# Patient Record
Sex: Male | Born: 1985 | Hispanic: No | Marital: Single | Smoking: Former smoker
Health system: Southern US, Community
[De-identification: ages and names within clinical notes are randomized; demographics above are authoritative.]

---

## 2019-03-19 ENCOUNTER — Other Ambulatory Visit: Payer: Self-pay | Admitting: Family Medicine

## 2019-03-19 DIAGNOSIS — R1011 Right upper quadrant pain: Secondary | ICD-10-CM

## 2019-03-20 ENCOUNTER — Other Ambulatory Visit: Payer: Self-pay | Admitting: Family Medicine

## 2019-03-20 DIAGNOSIS — R1011 Right upper quadrant pain: Secondary | ICD-10-CM

## 2019-03-21 ENCOUNTER — Ambulatory Visit
Admission: RE | Admit: 2019-03-21 | Discharge: 2019-03-21 | Disposition: A | Discharge status: Home / Self Care | Payer: BLUE CROSS/BLUE SHIELD | Source: Ambulatory Visit | Attending: Family Medicine | Admitting: Family Medicine

## 2019-03-21 DIAGNOSIS — R1011 Right upper quadrant pain: Secondary | ICD-10-CM

## 2020-09-12 IMAGING — US ULTRASOUND ABDOMEN LIMITED
1 series · 14 of 25 positions shown · non-contrast
Comparison: None.

CLINICAL DATA: Nausea and vomiting

EXAM:
ULTRASOUND ABDOMEN LIMITED RIGHT UPPER QUADRANT

[Series 1: ultrasound abdomen limited · 0.19mm/px · 14 of 46 slices shown]
[im 1/46]
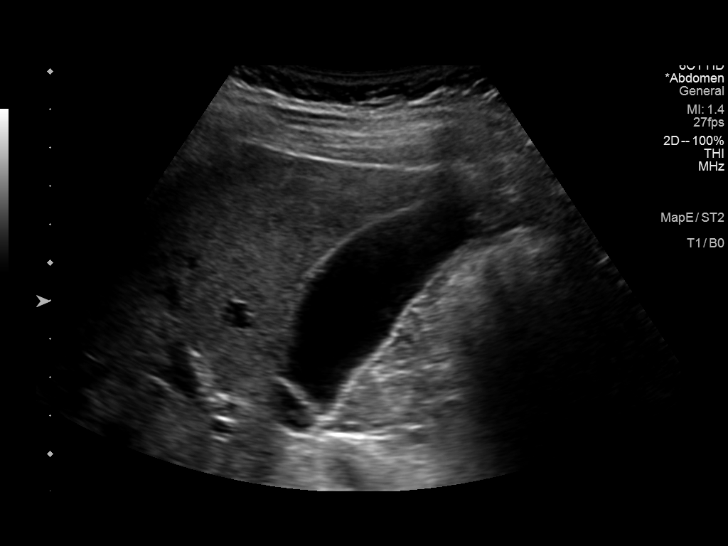
[im 4/46]
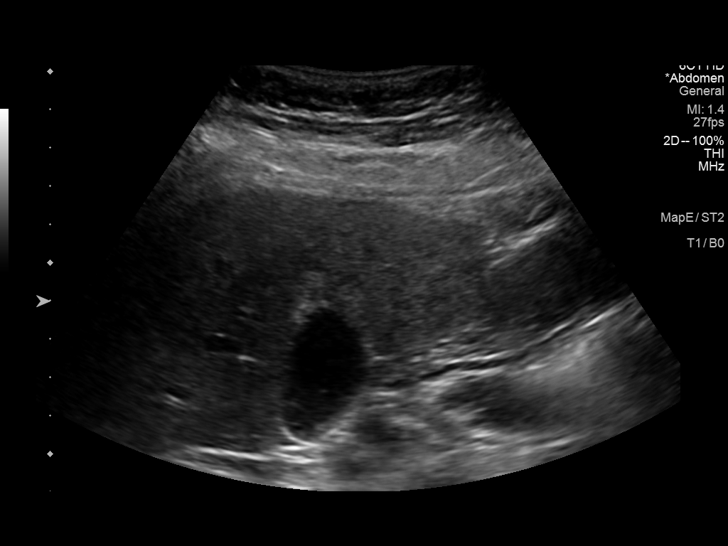
[im 8/46]
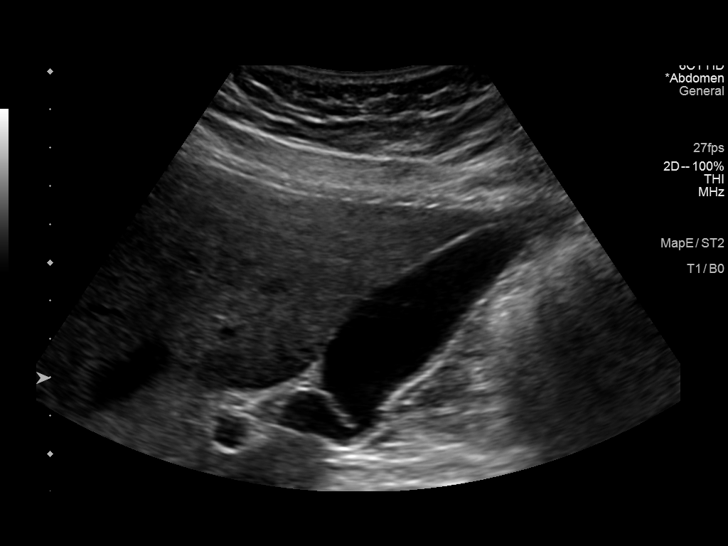
[im 12/46]
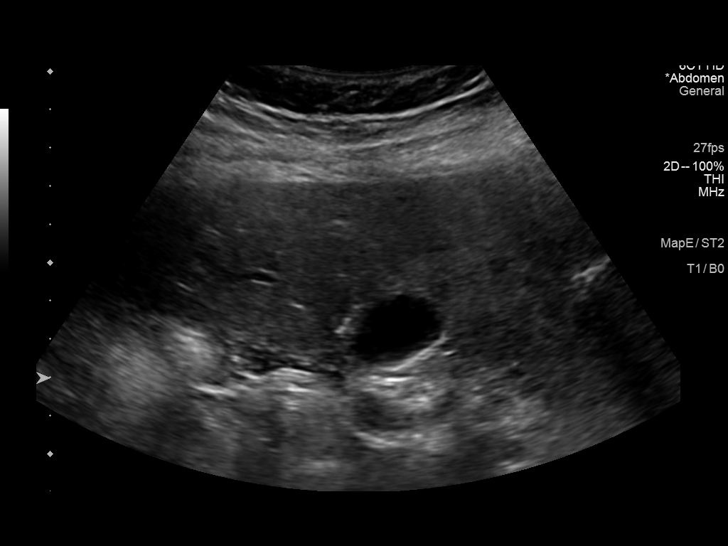
[im 16/46]
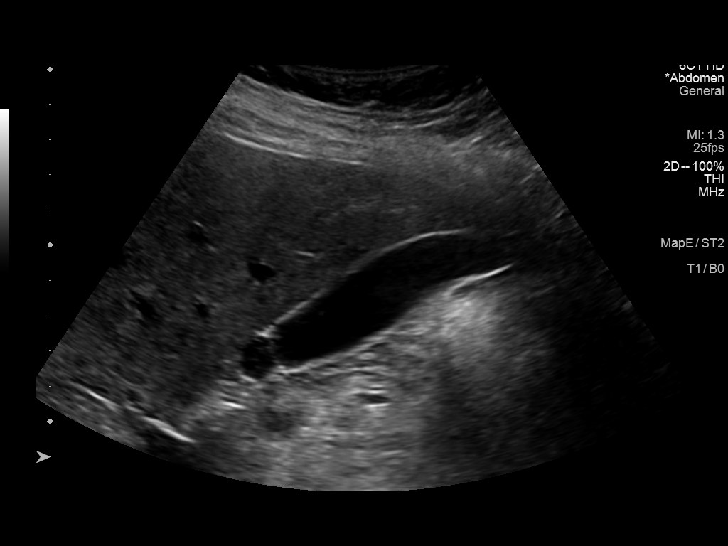
[im 17/46]
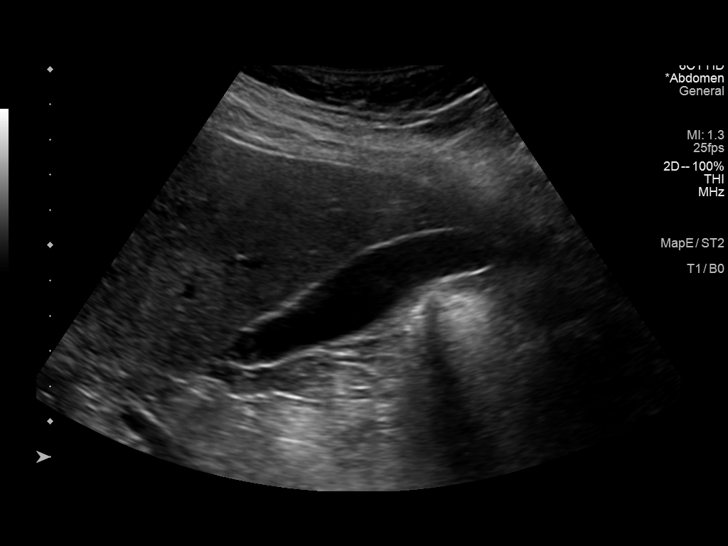
[im 21/46]
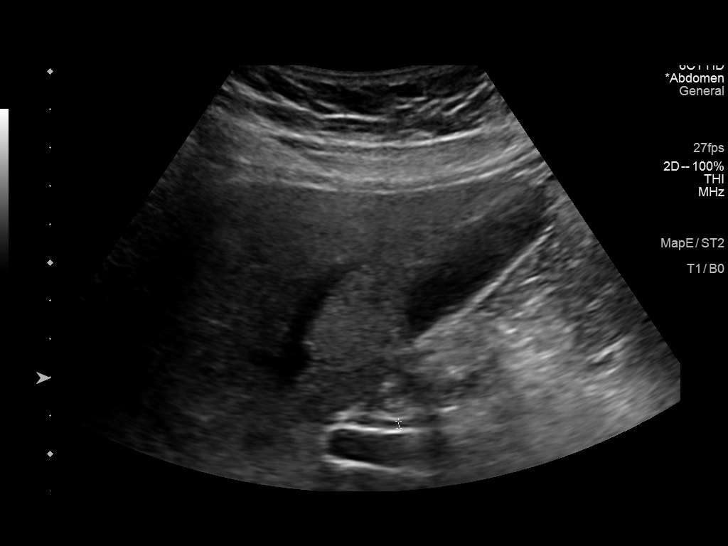
[im 25/46]
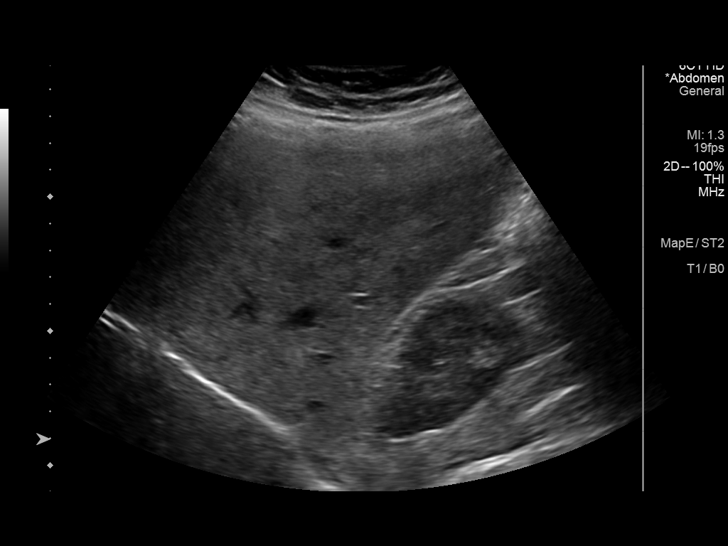
[im 29/46]
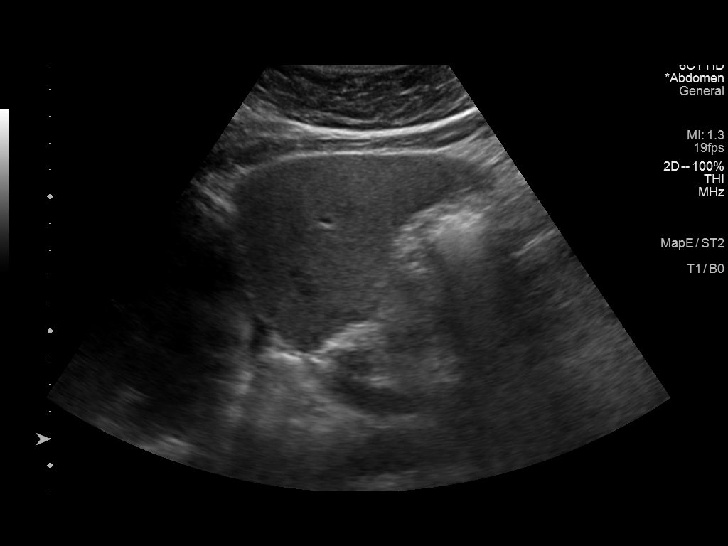
[im 31/46]
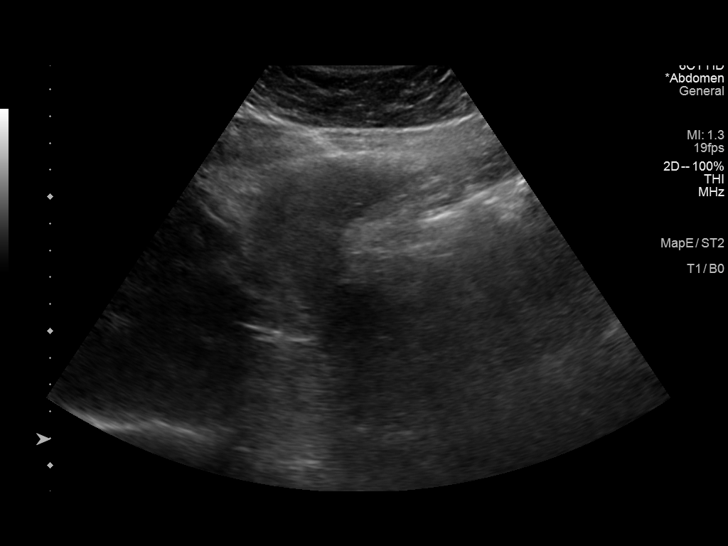
[im 34/46]
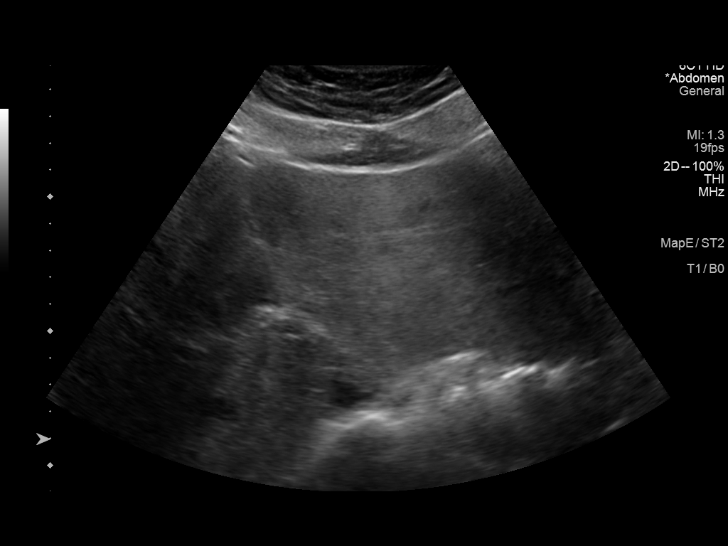
[im 38/46]
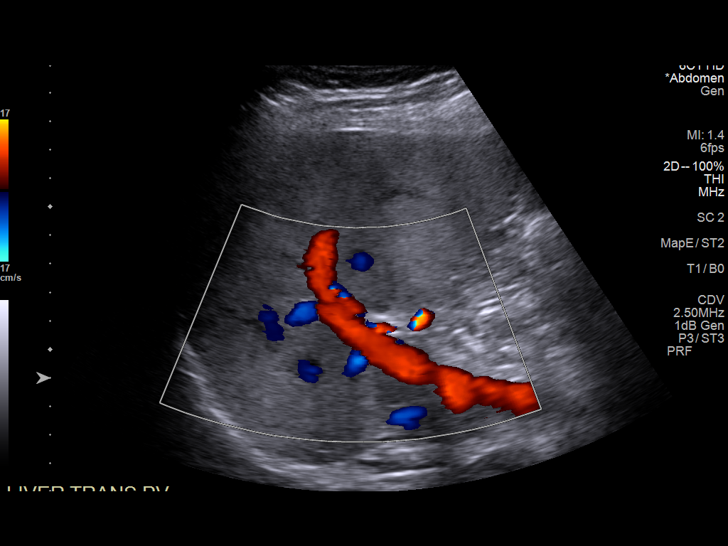
[im 42/46]
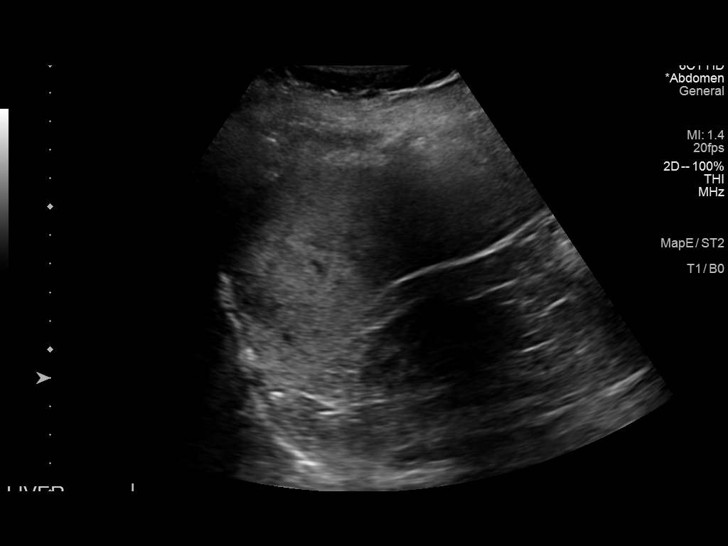
[im 46/46]
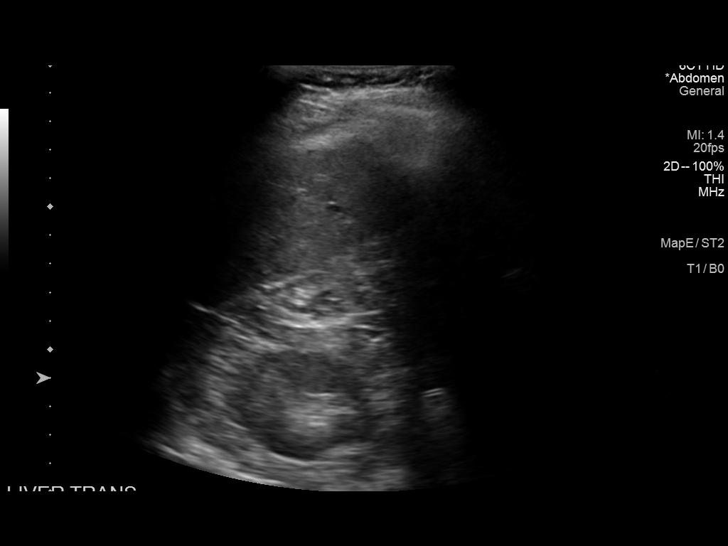

[14 of 25 positions shown; findings below may reference images not displayed]

FINDINGS: Gallbladder:

No gallstones or wall thickening visualized. No sonographic Murphy
sign noted by sonographer.

Common bile duct:

Diameter: Normal at 2 mm

Liver:

In the subcapsular RIGHT hepatic lobe hypoechoic lesion measures 13
mm x 13 mm.

Liver normal limits in parenchymal echogenicity. Portal vein is
patent on color Doppler imaging with normal direction of blood flow
towards the liver.
IMPRESSION: 1. Normal gallbladder.  No biliary duct dilatation.
2. Indeterminate lesion in the subcapsular RIGHT hepatic lobe.
Recommend contrast MRI for further evaluation.

## 2020-10-16 ENCOUNTER — Encounter (HOSPITAL_COMMUNITY)
Admission: EM | Disposition: A | Discharge status: Home / Self Care | Payer: Self-pay | Source: Home / Self Care | Attending: Emergency Medicine

## 2020-10-16 ENCOUNTER — Encounter (HOSPITAL_COMMUNITY): Payer: Self-pay | Admitting: *Deleted

## 2020-10-16 ENCOUNTER — Other Ambulatory Visit: Payer: Self-pay

## 2020-10-16 ENCOUNTER — Ambulatory Visit (HOSPITAL_COMMUNITY)
Admission: EM | Admit: 2020-10-16 | Discharge: 2020-10-16 | Disposition: A | Discharge status: Home / Self Care | Payer: Self-pay | Attending: Emergency Medicine | Admitting: Emergency Medicine

## 2020-10-16 ENCOUNTER — Emergency Department (HOSPITAL_COMMUNITY): Payer: Self-pay | Admitting: Registered Nurse

## 2020-10-16 ENCOUNTER — Emergency Department (HOSPITAL_COMMUNITY): Payer: Self-pay

## 2020-10-16 DIAGNOSIS — Z881 Allergy status to other antibiotic agents status: Secondary | ICD-10-CM | POA: Insufficient documentation

## 2020-10-16 DIAGNOSIS — Z20822 Contact with and (suspected) exposure to covid-19: Secondary | ICD-10-CM | POA: Insufficient documentation

## 2020-10-16 DIAGNOSIS — Z87891 Personal history of nicotine dependence: Secondary | ICD-10-CM | POA: Insufficient documentation

## 2020-10-16 DIAGNOSIS — N4400 Torsion of testis, unspecified: Secondary | ICD-10-CM | POA: Insufficient documentation

## 2020-10-16 HISTORY — PX: ORCHIECTOMY: SHX2116

## 2020-10-16 LAB — CBC
HCT: 43.5 % (ref 39.0–52.0)
Hemoglobin: 14 g/dL (ref 13.0–17.0)
MCH: 27.4 pg (ref 26.0–34.0)
MCHC: 32.2 g/dL (ref 30.0–36.0)
MCV: 85.1 fL (ref 80.0–100.0)
Platelets: 321 10*3/uL (ref 150–400)
RBC: 5.11 MIL/uL (ref 4.22–5.81)
RDW: 15.4 % (ref 11.5–15.5)
WBC: 15.4 10*3/uL — ABNORMAL HIGH (ref 4.0–10.5)
nRBC: 0 % (ref 0.0–0.2)

## 2020-10-16 LAB — COMPREHENSIVE METABOLIC PANEL
ALT: 22 U/L (ref 0–44)
AST: 28 U/L (ref 15–41)
Albumin: 4.8 g/dL (ref 3.5–5.0)
Alkaline Phosphatase: 64 U/L (ref 38–126)
Anion gap: 14 (ref 5–15)
BUN: 11 mg/dL (ref 6–20)
CO2: 23 mmol/L (ref 22–32)
Calcium: 9.3 mg/dL (ref 8.9–10.3)
Chloride: 104 mmol/L (ref 98–111)
Creatinine, Ser: 1.18 mg/dL (ref 0.61–1.24)
GFR, Estimated: >60 mL/min (ref >60)
Glucose, Bld: 119 mg/dL — ABNORMAL HIGH (ref 70–99)
Potassium: 3.4 mmol/L — ABNORMAL LOW (ref 3.5–5.1)
Sodium: 141 mmol/L (ref 135–145)
Total Bilirubin: 0.6 mg/dL (ref 0.3–1.2)
Total Protein: 7.8 g/dL (ref 6.5–8.1)

## 2020-10-16 LAB — RESP PANEL BY RT-PCR (FLU A&B, COVID) ARPGX2
Influenza A by PCR: NEGATIVE
Influenza B by PCR: NEGATIVE
SARS Coronavirus 2 by RT PCR: NEGATIVE

## 2020-10-16 LAB — LIPASE, BLOOD: Lipase: 47 U/L (ref 11–51)

## 2020-10-16 SURGERY — ORCHIECTOMY
Anesthesia: General | Site: Scrotum | Laterality: Bilateral

## 2020-10-16 MED ORDER — HYDROMORPHONE HCL 1 MG/ML IJ SOLN
1.0000 mg | Freq: Once | INTRAMUSCULAR | Status: AC
  Administered 2020-10-16: 09:00:00 1 mg via INTRAVENOUS
  Filled 2020-10-16: qty 1

## 2020-10-16 MED ORDER — BUPIVACAINE-EPINEPHRINE 0.5% -1:200000 IJ SOLN
INTRAMUSCULAR | Status: DC | PRN
  Administered 2020-10-16: 20 mL

## 2020-10-16 MED ORDER — CEFAZOLIN SODIUM-DEXTROSE 2-4 GM/100ML-% IV SOLN
2.0000 g | Freq: Once | INTRAVENOUS | Status: AC
  Administered 2020-10-16: 12:00:00 2 g via INTRAVENOUS

## 2020-10-16 MED ORDER — FENTANYL CITRATE (PF) 250 MCG/5ML IJ SOLN
INTRAMUSCULAR | Status: AC
  Filled 2020-10-16: qty 5

## 2020-10-16 MED ORDER — 0.9 % SODIUM CHLORIDE (POUR BTL) OPTIME
TOPICAL | Status: DC | PRN
Start: 2020-10-16 — End: 2020-10-16
  Administered 2020-10-16: 12:00:00 1000 mL

## 2020-10-16 MED ORDER — ONDANSETRON HCL 4 MG/2ML IJ SOLN
INTRAMUSCULAR | Status: AC
  Filled 2020-10-16: qty 2

## 2020-10-16 MED ORDER — GLYCOPYRROLATE PF 0.2 MG/ML IJ SOSY
PREFILLED_SYRINGE | INTRAMUSCULAR | Status: DC | PRN
  Administered 2020-10-16: .1 mg via INTRAVENOUS

## 2020-10-16 MED ORDER — PROPOFOL 10 MG/ML IV BOLUS
INTRAVENOUS | Status: DC | PRN
  Administered 2020-10-16: 200 mg via INTRAVENOUS

## 2020-10-16 MED ORDER — LACTATED RINGERS IV SOLN: INTRAVENOUS | Status: DC | PRN

## 2020-10-16 MED ORDER — HYDROCODONE-ACETAMINOPHEN 5-325 MG PO TABS: 1.0000 | ORAL_TABLET | ORAL | 0 refills | Status: DC | PRN

## 2020-10-16 MED ORDER — HYDROMORPHONE HCL 1 MG/ML IJ SOLN
1.0000 mg | Freq: Once | INTRAMUSCULAR | Status: AC
  Administered 2020-10-16: 08:00:00 1 mg via INTRAVENOUS
  Filled 2020-10-16: qty 1

## 2020-10-16 MED ORDER — DEXAMETHASONE SODIUM PHOSPHATE 10 MG/ML IJ SOLN
INTRAMUSCULAR | Status: DC | PRN
  Administered 2020-10-16: 10 mg via INTRAVENOUS

## 2020-10-16 MED ORDER — SUCCINYLCHOLINE CHLORIDE 200 MG/10ML IV SOSY
PREFILLED_SYRINGE | INTRAVENOUS | Status: DC | PRN
  Administered 2020-10-16: 20 mg via INTRAVENOUS

## 2020-10-16 MED ORDER — ONDANSETRON HCL 4 MG/2ML IJ SOLN
INTRAMUSCULAR | Status: DC | PRN
  Administered 2020-10-16: 4 mg via INTRAVENOUS

## 2020-10-16 MED ORDER — ONDANSETRON HCL 4 MG/2ML IJ SOLN
4.0000 mg | Freq: Once | INTRAMUSCULAR | Status: AC
  Administered 2020-10-16: 08:00:00 4 mg via INTRAVENOUS
  Filled 2020-10-16: qty 2

## 2020-10-16 MED ORDER — PROPOFOL 10 MG/ML IV BOLUS
INTRAVENOUS | Status: AC
  Filled 2020-10-16: qty 40

## 2020-10-16 MED ORDER — FENTANYL CITRATE (PF) 100 MCG/2ML IJ SOLN: 25.0000 ug | INTRAMUSCULAR | Status: DC | PRN

## 2020-10-16 MED ORDER — OXYCODONE HCL 5 MG/5ML PO SOLN: 5.0000 mg | Freq: Once | ORAL | Status: DC | PRN

## 2020-10-16 MED ORDER — ONDANSETRON HCL 4 MG/2ML IJ SOLN: 4.0000 mg | Freq: Four times a day (QID) | INTRAMUSCULAR | Status: DC | PRN

## 2020-10-16 MED ORDER — OXYCODONE HCL 5 MG PO TABS
5.0000 mg | ORAL_TABLET | Freq: Once | ORAL | Status: DC | PRN
Start: 2020-10-16 — End: 2020-10-16

## 2020-10-16 MED ORDER — CEFAZOLIN SODIUM-DEXTROSE 2-4 GM/100ML-% IV SOLN
INTRAVENOUS | Status: AC
  Filled 2020-10-16: qty 100

## 2020-10-16 MED ORDER — MIDAZOLAM HCL 2 MG/2ML IJ SOLN
INTRAMUSCULAR | Status: AC
  Filled 2020-10-16: qty 2

## 2020-10-16 MED ORDER — MIDAZOLAM HCL 5 MG/5ML IJ SOLN
INTRAMUSCULAR | Status: DC | PRN
  Administered 2020-10-16 (×2): 2 mg via INTRAVENOUS

## 2020-10-16 MED ORDER — FENTANYL CITRATE (PF) 100 MCG/2ML IJ SOLN
INTRAMUSCULAR | Status: DC | PRN
  Administered 2020-10-16: 100 ug via INTRAVENOUS
  Administered 2020-10-16 (×2): 50 ug via INTRAVENOUS

## 2020-10-16 MED ORDER — LIDOCAINE 2% (20 MG/ML) 5 ML SYRINGE: INTRAMUSCULAR | Status: DC | PRN

## 2020-10-16 MED ORDER — BUPIVACAINE HCL (PF) 0.5 % IJ SOLN
INTRAMUSCULAR | Status: AC
  Filled 2020-10-16: qty 30

## 2020-10-16 MED ORDER — LIDOCAINE 2% (20 MG/ML) 5 ML SYRINGE
INTRAMUSCULAR | Status: DC | PRN
  Administered 2020-10-16: 50 mg via INTRAVENOUS

## 2020-10-16 MED ORDER — DEXAMETHASONE SODIUM PHOSPHATE 10 MG/ML IJ SOLN
INTRAMUSCULAR | Status: AC
  Filled 2020-10-16: qty 1

## 2020-10-16 MED ORDER — FENTANYL CITRATE (PF) 100 MCG/2ML IJ SOLN
INTRAMUSCULAR | Status: AC
  Filled 2020-10-16: qty 2

## 2020-10-16 SURGICAL SUPPLY — 28 items
BENZOIN TINCTURE PRP APPL 2/3 (GAUZE/BANDAGES/DRESSINGS) ×3 IMPLANT
BNDG GAUZE ELAST 4 BULKY (GAUZE/BANDAGES/DRESSINGS) ×3 IMPLANT
COVER WAND RF STERILE (DRAPES) IMPLANT
DECANTER SPIKE VIAL GLASS SM (MISCELLANEOUS) ×3 IMPLANT
DERMABOND ADVANCED (GAUZE/BANDAGES/DRESSINGS) ×2
DERMABOND ADVANCED .7 DNX12 (GAUZE/BANDAGES/DRESSINGS) ×1 IMPLANT
DRAIN PENROSE 0.25X18 (DRAIN) IMPLANT
DRAIN PENROSE 0.5X18 (DRAIN) IMPLANT
DRAPE LAPAROTOMY T 98X78 PEDS (DRAPES) ×3 IMPLANT
ELECT REM PT RETURN 15FT ADLT (MISCELLANEOUS) ×3 IMPLANT
GAUZE SPONGE 4X4 12PLY STRL (GAUZE/BANDAGES/DRESSINGS) ×3 IMPLANT
GLOVE SURG SS PI 8.0 STRL IVOR (GLOVE) ×3 IMPLANT
GOWN STRL REUS W/TWL XL LVL3 (GOWN DISPOSABLE) ×3 IMPLANT
KIT BASIN OR (CUSTOM PROCEDURE TRAY) ×3 IMPLANT
KIT TURNOVER KIT A (KITS) IMPLANT
NEEDLE HYPO 22GX1.5 SAFETY (NEEDLE) ×3 IMPLANT
NS IRRIG 1000ML POUR BTL (IV SOLUTION) IMPLANT
PACK GENERAL/GYN (CUSTOM PROCEDURE TRAY) ×3 IMPLANT
SUPPORT SCROTAL LG STRP (MISCELLANEOUS) ×2 IMPLANT
SUPPORTER ATHLETIC LG (MISCELLANEOUS) ×1
SUT CHROMIC 3 0 SH 27 (SUTURE) ×6 IMPLANT
SUT CHROMIC 4 0 SH 27 (SUTURE) IMPLANT
SUT PROLENE 4 0 PS 2 18 (SUTURE) ×6 IMPLANT
SUT VIC AB 3-0 SH 27 (SUTURE) ×2
SUT VIC AB 3-0 SH 27XBRD (SUTURE) ×1 IMPLANT
SUT VICRYL 0 TIES 12 18 (SUTURE) ×3 IMPLANT
SYR CONTROL 10ML LL (SYRINGE) ×3 IMPLANT
WATER STERILE IRR 1000ML POUR (IV SOLUTION) IMPLANT

## 2020-10-16 NOTE — ED Triage Notes (Signed)
2 days of testicular pain followed by N/V this morning.

## 2020-10-16 NOTE — ED Notes (Signed)
Ultrasound at bedside

## 2020-10-16 NOTE — ED Notes (Signed)
Urinal at bedside; pt aware urine sample is needed.  

## 2020-10-16 NOTE — Anesthesia Preprocedure Evaluation (Signed)
Anesthesia Evaluation  Patient identified by MRN, date of birth, ID band Patient awake    Reviewed: Allergy & Precautions, H&P , NPO status , Patient's Chart, lab work & pertinent test results  Airway Mallampati: II   Neck ROM: full    Dental   Pulmonary former smoker,    breath sounds clear to auscultation       Cardiovascular negative cardio ROS   Rhythm:regular Rate:Normal     Neuro/Psych    GI/Hepatic   Endo/Other    Renal/GU      Musculoskeletal   Abdominal   Peds  Hematology   Anesthesia Other Findings   Reproductive/Obstetrics                             Anesthesia Physical Anesthesia Plan  ASA: I and emergent  Anesthesia Plan: General   Post-op Pain Management:    Induction: Intravenous  PONV Risk Score and Plan: 2 and Ondansetron, Dexamethasone, Midazolam and Treatment may vary due to age or medical condition  Airway Management Planned: Oral ETT  Additional Equipment:   Intra-op Plan:   Post-operative Plan: Extubation in OR  Informed Consent: I have reviewed the patients History and Physical, chart, labs and discussed the procedure including the risks, benefits and alternatives for the proposed anesthesia with the patient or authorized representative who has indicated his/her understanding and acceptance.       Plan Discussed with: CRNA, Anesthesiologist and Surgeon  Anesthesia Plan Comments:         Anesthesia Quick Evaluation

## 2020-10-16 NOTE — ED Provider Notes (Signed)
[facility]-EMERGENCY DEPT Provider Note   CSN: [id number] Arrival date & time: 10/16/20  [id number]     History Chief Complaint  Patient presents with  . Testicle Pain    Brian Dalton is a 34 y.o. male.  HPI He presents for evaluation of left testicle pain, gradually worse over the last 2 days, now intolerable.  No dysuria, penile discharge, fever or chills.  Pain radiates to his lower abdomen.  No similar problem in the past.  There are no other known modifying factors.    History reviewed. No pertinent past medical history.  There are no problems to display for this patient.   History reviewed. No pertinent surgical history.     History reviewed. No pertinent family history.  Social History   Tobacco Use  . Smoking status: Former Games developermoker  . Smokeless tobacco: Never Used  Substance Use Topics  . Alcohol use: Yes  . Drug use: Yes    Types: Marijuana    Home Medications Prior to Admission medications   Medication Sig Start Date End Date Taking? Authorizing Provider  HYDROcodone-acetaminophen (NORCO) 5-325 MG tablet Take 1 tablet by mouth every 4 (four) hours as needed for moderate pain. 10/16/20   Noel ChristmasPace, Maryellen D, MD    Allergies    Doxycycline  Review of Systems   Review of Systems  All other systems reviewed and are negative.   Physical Exam Updated Vital Signs BP 121/74 (BP Location: Right Arm)   Pulse (!) 47   Temp 98.1 F (36.7 C)   Resp 13   Ht 5\' 7"  (1.702 m)   Wt 86.2 kg   SpO2 99%   BMI 29.76 kg/m   Physical Exam Vitals and nursing note reviewed.  Constitutional:      Appearance: He is well-developed and well-nourished.  HENT:     Head: Normocephalic and atraumatic.     Right Ear: External ear normal.     Left Ear: External ear normal.  Eyes:     Extraocular Movements: EOM normal.     Conjunctiva/sclera: Conjunctivae normal.     Pupils: Pupils are equal, round, and reactive to light.  Neck:     Trachea:  Phonation normal.  Cardiovascular:     Rate and Rhythm: Normal rate.  Pulmonary:     Effort: Pulmonary effort is normal. No respiratory distress.  Chest:     Chest wall: No bony tenderness.  Abdominal:     General: There is no distension.     Palpations: Abdomen is soft.     Tenderness: There is no abdominal tenderness.  Genitourinary:    Comments: External genitalia appears normal.  No urethral discharge.  Testes are somewhat retracted bilaterally but I can palpate both.  Left testicle is tender but not swollen.  No abnormality of the inguinal region.  Scrotal contents otherwise appear normal. Musculoskeletal:        General: Normal range of motion.     Cervical back: Normal range of motion and neck supple.  Skin:    General: Skin is warm, dry and intact.  Neurological:     Mental Status: He is alert and oriented to person, place, and time.     Cranial Nerves: No cranial nerve deficit.     Sensory: No sensory deficit.     Motor: No abnormal muscle tone.     Coordination: Coordination normal.  Psychiatric:        Mood and Affect: Mood and affect and mood normal.  Behavior: Behavior normal.        Thought Content: Thought content normal.        Judgment: Judgment normal.     ED Results / Procedures / Treatments   Labs (all labs ordered are listed, but only abnormal results are displayed) Labs Reviewed  COMPREHENSIVE METABOLIC PANEL - Abnormal; Notable for the following components:      Result Value   Potassium 3.4 (*)    Glucose, Bld 119 (*)    All other components within normal limits  CBC - Abnormal; Notable for the following components:   WBC 15.4 (*)    All other components within normal limits  RESP PANEL BY RT-PCR (FLU A&B, COVID) ARPGX2  LIPASE, BLOOD  URINALYSIS, ROUTINE W REFLEX MICROSCOPIC    EKG None  Radiology US SCROTUM W/DOPPLER  Result Date: 10/16/2020 CLINICAL DATA:  LEFT testicular pain EXAM: SCROTAL ULTRASOUND DOPPLER ULTRASOUND OF THE  TESTICLES TECHNIQUE: Complete ultrasound examination of the testicles, epididymis, and other scrotal structures was performed. Color and spectral Doppler ultrasound were also utilized to evaluate blood flow to the testicles. COMPARISON:  None. FINDINGS: Right testicle Measurements: Normal in size and homogeneous in echotexture measuring 4.5 x 2.2 x 2.8 cm. No mass or microlithiasis visualized. Left testicle Measurements: Normal size and homogeneous in echotexture measuring 4.1 x 2.2 x 2.7 cm. No mass or microlithiasis visualized. Right epididymis:  Small benign RIGHT epididymal cyst. Left epididymis:  Normal Hydrocele:  Small bilateral hydroceles Varicocele:  None Normal arterial and waveforms within the RIGHT testicle. No arterial waveforms were demonstrated within the LEFT testicle. Five different attempts were made to identify arterial waveforms which were unsuccessful. Venous waveforms were identified Additionally, patient was extremely tender upon examination and clearly exhibiting considerable pain. I personally conferred with ultrasound sound technologist. IMPRESSION: 1. No arterial waveforms demonstrated within a normal appearing LEFT testicle. Only venous waveforms identified on multiple attempts. Findings concerning for early or intermittent LEFT testicular torsion. Recommend emergent urology consultation. 2. Normal RIGHT testicle. Findings conveyed toELLIOTT Tyrian Peart on 10/16/2020  at10:17. Electronically Signed   By: Genevive Bi M.D.   On: 10/16/2020 10:23    Procedures Procedures (including critical care time)  Medications Ordered in ED Medications  oxyCODONE (Oxy IR/ROXICODONE) immediate release tablet 5 mg (has no administration in time range)    Or  oxyCODONE (ROXICODONE) 5 MG/5ML solution 5 mg (has no administration in time range)  fentaNYL (SUBLIMAZE) injection 25-50 mcg (has no administration in time range)  ondansetron (ZOFRAN) injection 4 mg (has no administration in time range)   0.9 % irrigation (POUR BTL) (1,000 mLs Irrigation Given 10/16/20 1159)  bupivacaine-EPINEPHrine (MARCAINE W/ EPI) 0.5% -1:200000 (with pres) injection (20 mLs Infiltration Given 10/16/20 1220)  fentaNYL (SUBLIMAZE) 100 MCG/2ML injection (has no administration in time range)  HYDROmorphone (DILAUDID) injection 1 mg (1 mg Intravenous Given 10/16/20 0759)  ondansetron (ZOFRAN) injection 4 mg (4 mg Intravenous Given 10/16/20 0758)  HYDROmorphone (DILAUDID) injection 1 mg (1 mg Intravenous Given 10/16/20 0910)  ceFAZolin (ANCEF) IVPB 2g/100 mL premix (2 g Intravenous Given 10/16/20 1154)    ED Course  I have reviewed the triage vital signs and the nursing notes.  Pertinent labs & imaging results that were available during my care of the patient were reviewed by me and considered in my medical decision making (see chart for details).  Clinical Course as of 10/16/20 2001  Sat Oct 16, 2020  1025 Case discussed with radiologist, Dr. Ty Hilts, who is reviewing the ultrasound  findings.  No arterial flow, found in left testicle.  Call back requested from urology. [EW]  1025 Case discussed with neurologist, Dr. Arita Miss who will come to the ED and see the patient. [EW]    Clinical Course User Index [EW] Mancel Bale, MD   MDM Rules/Calculators/A&P                           Patient Vitals for the past 24 hrs:  BP Temp Temp src Pulse Resp SpO2 Height Weight  10/16/20 1357 121/74 -- -- (!) 47 13 99 % -- --  10/16/20 1345 117/71 -- -- (!) 50 17 95 % -- --  10/16/20 1330 112/79 98.1 F (36.7 C) -- 65 16 98 % -- --  10/16/20 1315 113/61 -- -- 64 15 95 % -- --  10/16/20 1305 -- -- -- 83 18 100 % -- --  10/16/20 1300 (!) 110/57 -- -- (!) 49 12 100 % -- --  10/16/20 1248 -- -- -- (!) 51 10 100 % -- --  10/16/20 1243 129/74 97.6 F (36.4 C) -- 85 14 100 % -- --  10/16/20 1039 (!) 114/57 97.8 F (36.6 C) Oral 62 17 98 % -- --  10/16/20 0900 (!) 169/102 -- -- 64 18 100 % -- --  10/16/20 0830 124/74 --  -- (!) 50 -- 91 % -- --  10/16/20 0800 (!) 146/81 -- -- 64 20 100 % -- --  10/16/20 0723 -- -- -- -- -- -- 5\' 7"  (1.702 m) 86.2 kg  10/16/20 0722 (!) 142/85 97.8 F (36.6 C) Oral 64 17 100 % -- --     Medical Decision Making:  This patient is presenting for evaluation of left testicle pain, which does require a range of treatment options, and is a complaint that involves a high risk of morbidity and mortality. The differential diagnoses include epididymitis, testicular torsion, obstructing kidney stone. I decided to review old records, and in summary Young adult male presenting with left testicle pain, without other signs and symptoms of urinary tract infection.  He requires urgent evaluation for testicular torsion.  I did not require additional historical information from anyone.   Radiologic Tests Ordered, included scrotal ultrasound to rule out testicular torsion.  I independently Visualized: Neurologic images, which show abscess and arterial flow to the left testes   Critical Interventions-evaluation, ultrasound ordered urgently to evaluate for testicular torsion  After These Interventions, the Patient was reevaluated and was found ultrasound findings are consistent with testicular torsion.  Urology consulted for management.  Dr. 14/11/21 took the patient to the operating room for open examination of the left testicle  CRITICAL CARE-no Performed by: Arita Miss  Nursing Notes Reviewed/ Care Coordinated Applicable Imaging Reviewed Interpretation of Laboratory Data incorporated into ED treatment     Final Clinical Impression(s) / ED Diagnoses Final diagnoses:  Testicular torsion    Rx / DC Orders ED Discharge Orders         Ordered    HYDROcodone-acetaminophen (NORCO) 5-325 MG tablet  Every 4 hours PRN        10/16/20 1236    Increase activity slowly        10/16/20 1236    Diet - low sodium heart healthy        10/16/20 1236    No wound care        10/16/20 1236  Mancel Bale, MD 10/16/20 2002

## 2020-10-16 NOTE — Discharge Instructions (Signed)
Scrotal surgery postoperative instructions  Wound:  In most cases your incision will have absorbable sutures that will dissolve within the first 10-20 days. Some will fall out even earlier. Expect some redness as the sutures dissolved but this should occur only around the sutures. If there is generalized redness, especially with increasing pain or swelling, let us know. The scrotum will very likely get "black and blue" as the blood in the tissues spread. Sometimes the whole scrotum will turn colors. The black and blue is followed by a yellow and brown color. In time, all the discoloration will go away. In some cases some firm swelling in the area of the testicle may persist for up to 4-6 weeks after the surgery and is considered normal in most cases.  Diet:  You may return to your normal diet within 24 hours following your surgery. You may note some mild nausea and possibly vomiting the first 6-8 hours following surgery. This is usually due to the side effects of anesthesia, and will disappear quite soon. I would suggest clear liquids and a very light meal the first evening following your surgery.  Activity:  Your physical activity should be restricted the first 48 hours. During that time you should remain relatively inactive, moving about only when necessary. During the first 7-10 days following surgery he should avoid lifting any heavy objects (anything greater than 15 pounds), and avoid strenuous exercise. If you work, ask [place] specifically about your restrictions, both for work and home. We will write a note to your employer if needed.  You should plan to wear a tight pair of jockey shorts or an athletic supporter for the first 4-5 days, even to sleep. This will keep the scrotum immobilized to some degree and keep the swelling down.  Ice packs should be placed on and off over the scrotum for the first 48 hours. Frozen peas or corn in a ZipLock bag can be frozen, used and re-frozen. Fifteen minutes  on and 15 minutes off is a reasonable schedule. The ice is a good pain reliever and keeps the swelling down.  Hygiene:  You may shower 48 hours after your surgery. Tub bathing should be restricted until the seventh day.          Medication:  You will be sent home with some type of pain medication. In many cases you will be sent home with a narcotic pain pill (hydrococone or oxycodone). If the pain is not too bad, you may take either Tylenol (acetaminophen) or Advil (ibuprofen) which contain no narcotic agents, and might be tolerated a little better, with fewer side effects. If the pain medication you are sent home with does not control the pain, you will have to let us know. Some narcotic pain medications cannot be given or refilled by a phone call to a pharmacy.  Problems you should report to [place]:   Fever of 101.0 degrees Fahrenheit or greater.  Moderate or severe swelling under the skin incision or involving the scrotum.  Drug reaction such as hives, a rash, nausea or vomiting.    Work Note 10/16/20  To Whom It May Concern,  Please excuse Zaquan Duffner while he is under my clinical care 10/15/20-10/22/20.     Sincerely,  Kasandra Knudsen, MD [facility] [phone]

## 2020-10-16 NOTE — ED Notes (Signed)
Dr Effie Shy at bedside

## 2020-10-16 NOTE — Consult Note (Signed)
I have been asked to see the patient by Dr. Mancel Bale, for evaluation and management of testicular torsion.  History of present illness: 34 year old man with 2-day history of worsening left testicular pain found to have no arterial blood flow on scrotal ultrasound concerning for testicular torsion.     Review of systems: A 12 point comprehensive review of systems was obtained and is negative unless otherwise stated in the history of present illness.  There are no problems to display for this patient.   No current facility-administered medications on file prior to encounter.   No current outpatient medications on file prior to encounter.    History reviewed. No pertinent past medical history.  History reviewed. No pertinent surgical history.  Social History   Tobacco Use  . Smoking status: Former Games developer  . Smokeless tobacco: Never Used  Substance Use Topics  . Alcohol use: Yes  . Drug use: Yes    Types: Marijuana    History reviewed. No pertinent family history.  PE: Vitals:   10/16/20 0800 10/16/20 0830 10/16/20 0900 10/16/20 1039  BP: (!) 146/81 124/74 (!) 169/102 (!) 114/57  Pulse: 64 (!) 50 64 62  Resp: 20  18 17   Temp:    97.8 F (36.6 C)  TempSrc:    Oral  SpO2: 100% 91% 100% 98%  Weight:      Height:       Patient appears to be in no acute distress  patient is alert and oriented x3 Atraumatic normocephalic head No cervical or supraclavicular lymphadenopathy appreciated No increased work of breathing, no audible wheezes/rhonchi Regular sinus rhythm/rate Abdomen is soft, nontender, nondistended, no CVA or suprapubic tenderness Lower extremities are symmetric without appreciable edema GU circumcised phallus, left testis tender to palpation but otherwise feels without abnormality; no enlargement; normal right testis Grossly neurologically intact No identifiable skin lesions  Recent Labs    10/16/20 0740  WBC 15.4*  HGB 14.0  HCT 43.5   Recent Labs     10/16/20 0740  NA 141  K 3.4*  CL 104  CO2 23  GLUCOSE 119*  BUN 11  CREATININE 1.18  CALCIUM 9.3   No results for input(s): LABPT, INR in the last 72 hours. No results for input(s): LABURIN in the last 72 hours. No results found for this or any previous visit.  Imaging: Scrotal ultrasound  10/16/2020 IMPRESSION: 1. No arterial waveforms demonstrated within a normal appearing LEFT testicle. Only venous waveforms identified on multiple attempts. Findings concerning for early or intermittent LEFT testicular torsion. Recommend emergent urology consultation. 2. Normal RIGHT testicle.  Findings conveyed toELLIOTT WENTZ on 10/16/2020  at10:17.   Electronically Signed   By: 14/09/2020 M.D.   On: 10/16/2020 10:66  Imp: 33 year old man with left testicular torsion.  Recommendations: -Reviewed scrotal ultrasound and physical exam which were consistent with a left testicular torsion -Recommend emergent scrotal exploration with possible left orchiopexy versus left orchiectomy and right orchiopexy -Risks and benefits of the procedure discussed with the patient including but not limited to possible need for left orchiectomy, bleeding, pain, infection, damage to adjacent structures, need for future intervention -We discussed that the longer the testicle has no blood flow the higher the chances are of infarction and need for removal -to OR for emergent surgery   Thank you for involving me in this patient's care. Please page with any further questions or concerns. Raena Pau D Steffany Schoenfelder

## 2020-10-16 NOTE — Anesthesia Procedure Notes (Signed)
Procedure Name: Intubation Date/Time: 10/16/2020 11:39 AM Performed by: Lissa Morales, CRNA Pre-anesthesia Checklist: Patient identified, Emergency Drugs available, Suction available and Patient being monitored Patient Re-evaluated:Patient Re-evaluated prior to induction Oxygen Delivery Method: Circle system utilized Preoxygenation: Pre-oxygenation with 100% oxygen Induction Type: IV induction, Cricoid Pressure applied and Rapid sequence Laryngoscope Size: Mac and 4 Tube type: Oral Tube size: 7.5 mm Number of attempts: 1 Airway Equipment and Method: Stylet and Oral airway Placement Confirmation: ETT inserted through vocal cords under direct vision,  positive ETCO2 and breath sounds checked- equal and bilateral Tube secured with: Tape Dental Injury: Teeth and Oropharynx as per pre-operative assessment

## 2020-10-16 NOTE — Op Note (Signed)
Preoperative diagnosis:  1. Left testicular torsion  Postoperative diagnosis:  1. Left intermittent testicular torsoin  Procedure: scrotal exploration, bilateral orchiopexy  Surgeon: Kasandra Knudsen, MD  Anesthesia: General  Complications: None  Intraoperative findings:  1. Healthy appearing left testis without any twist/torsion noted in cord 2. Healthy appearing right testis 3. Bilateral 3 point fixation orchieopexy with 4-0 prolene  EBL: Minimal  Specimens: None  Indication: Indication: 34 year old man who presented to the ED with 2 days of worsening left testicular pain and a scrotal ultrasound without arterial blood flow concerning for testicular torsion.  After reviewing the management options for treatment, he elected to proceed with the above surgical procedure(s). We have discussed the potential benefits and risks of the procedure, side effects of the proposed treatment, the likelihood of the patient achieving the goals of the procedure, and any potential problems that might occur during the procedure or recuperation. Informed consent has been obtained.  Description of procedure:  The patient was taken to the operating room and general anesthesia was induced. The patient was placed on the table in supine position, general anesthesia was then induced. The scrotum was then prepped and draped in the routine sterile fashion. A timeout was then held with confirmation of antibiotics.  I then made a midline incision through the scrotal median raphae through the skin and into the dartos. Once through several layers the dartos was able to get the left testicle and contents out of the left  hemiscrotum and into the surgical field.  The tunica vaginalis was opened and the left testicle and spermatic cord were examined.  There were no twists of the cord noted and the testicle appeared pink and healthy.  No other abnormality was noted.  The appendix testes was then cauterized.  Hemostasis was  achieved.  The testicle and left hemiscrotum was irrigated copiously.  A three-point fixation was then performed with 4-0 Prolene medial, lateral and inferior.  The sutures were tied down to fix the left testicle in place with the lateral sulcus lateral taking care to ensure the spermatic cord was in proper anatomic position.   Attention then turned to the right side where the same procedure was performed.  The right testicle was also placed back in the right hemiscrotum with the lateral sulcus lateral and a three-point fixation orchidopexy was performed.  Meticulous hemostasis was then achieved.  I then closed the dartos with a 3-0 Vicryl in a running. I closed the skin with a 3-0 chromic horizontal mattress sutures. I then injected 10 cc of quarter percent Marcaine into the incision, and then placed Dermabond over the incision. A fluff dressing and mesh underpants were then applied area.  The patient tolerated the procedure without any perioperative complications. At the end of the case all last needles and sponges had been accounted for. The patient was returned to the PACU in excellent condition.   Kasandra Knudsen, M.D.

## 2020-10-16 NOTE — ED Notes (Signed)
Pt requesting more pain medications. Made Dr Effie Shy aware. Awaiting new orders

## 2020-10-16 NOTE — ED Notes (Signed)
Pt adds that he noticed blood in urine on Monday and Tuesday. Reports that had issues with his stream (urine flow) for years but never had checked.

## 2020-10-16 NOTE — ED Notes (Addendum)
PACU called and stated to bring pt over. Getting pt's belongings and pt ready to transport to PACU.

## 2020-10-16 NOTE — Transfer of Care (Signed)
Immediate Anesthesia Transfer of Care Note  Patient: Brian Dalton  Procedure(s) Performed: scrotal exploration bilateral scrotal orchiopexy (Bilateral Scrotum)  Patient Location: PACU  Anesthesia Type:General  Level of Consciousness: awake, alert , oriented and patient cooperative  Airway & Oxygen Therapy: Patient Spontanous Breathing and Patient connected to face mask oxygen  Post-op Assessment: Report given to RN, Post -op Vital signs reviewed and stable and Patient moving all extremities X 4  Post vital signs: stable  Last Vitals:  Vitals Value Taken Time  BP 129/74 10/16/20 1245  Temp    Pulse 64 10/16/20 1246  Resp 14 10/16/20 1246  SpO2 100 % 10/16/20 1246  Vitals shown include unvalidated device data.  Last Pain:  Vitals:   10/16/20 1039  TempSrc: Oral  PainSc:          Complications: No complications documented.

## 2020-10-18 ENCOUNTER — Encounter (HOSPITAL_COMMUNITY): Payer: Self-pay | Admitting: Urology

## 2020-10-19 NOTE — Anesthesia Postprocedure Evaluation (Signed)
Anesthesia Post Note  Patient: Brian Dalton  Procedure([place]) Performed: scrotal exploration bilateral scrotal orchiopexy (Bilateral Scrotum)     Patient location during evaluation: PACU Anesthesia Type: General Level of consciousness: awake and alert Pain management: pain level controlled Vital Signs Assessment: post-procedure vital signs reviewed and stable Respiratory status: spontaneous breathing, nonlabored ventilation, respiratory function stable and patient connected to nasal cannula oxygen Cardiovascular status: blood pressure returned to baseline and stable Postop Assessment: no apparent nausea or vomiting Anesthetic complications: no   No complications documented.  Last Vitals:  Vitals:   10/16/20 1345 10/16/20 1357  BP: 117/71 121/74  Pulse: (!) 50 (!) 47  Resp: 17 13  Temp:    SpO2: 95% 99%    Last Pain:  Vitals:   10/16/20 1357  TempSrc:   PainSc: 0-No pain                 Brian Dalton

## 2022-04-10 IMAGING — US US SCROTUM W/ DOPPLER COMPLETE
1 series · 13 of 25 positions shown · non-contrast
Comparison: None.

CLINICAL DATA: LEFT testicular pain

EXAM:
SCROTAL ULTRASOUND
DOPPLER ULTRASOUND OF THE TESTICLES
TECHNIQUE: Complete ultrasound examination of the testicles, epididymis, and
other scrotal structures was performed. Color and spectral Doppler
ultrasound were also utilized to evaluate blood flow to the
testicles.

[Series 1: us scrotum w/ doppler complete · 13 of 92 slices shown]
[im 1/92]
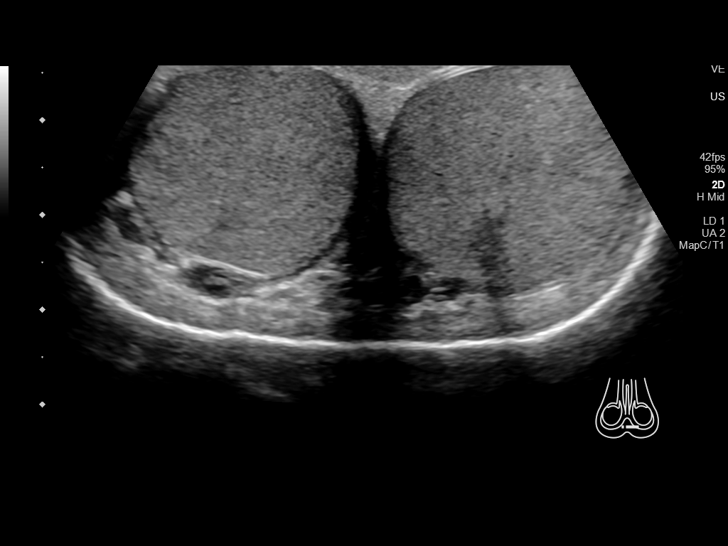
[im 8/92]
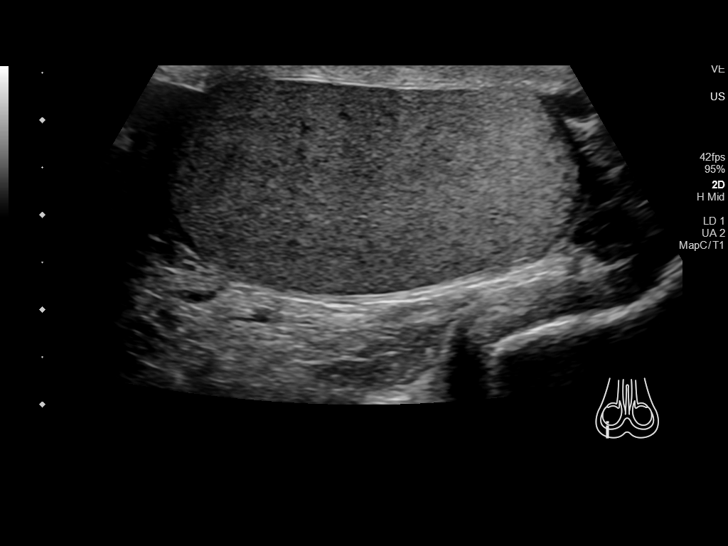
[im 16/92]
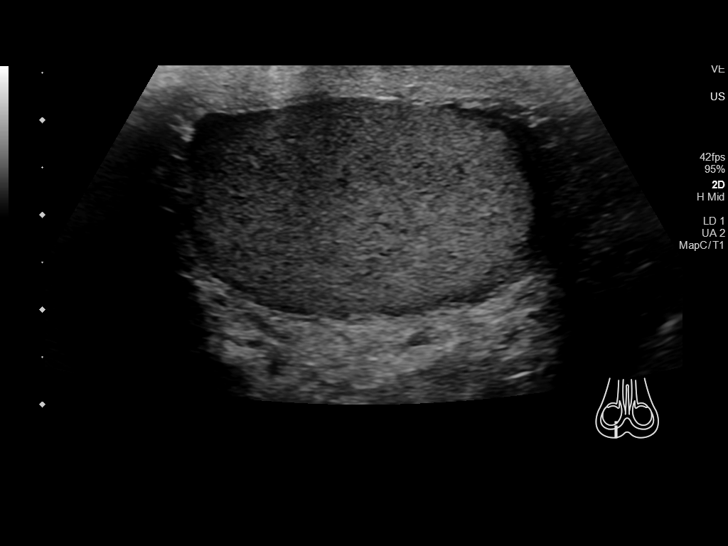
[im 23/92]
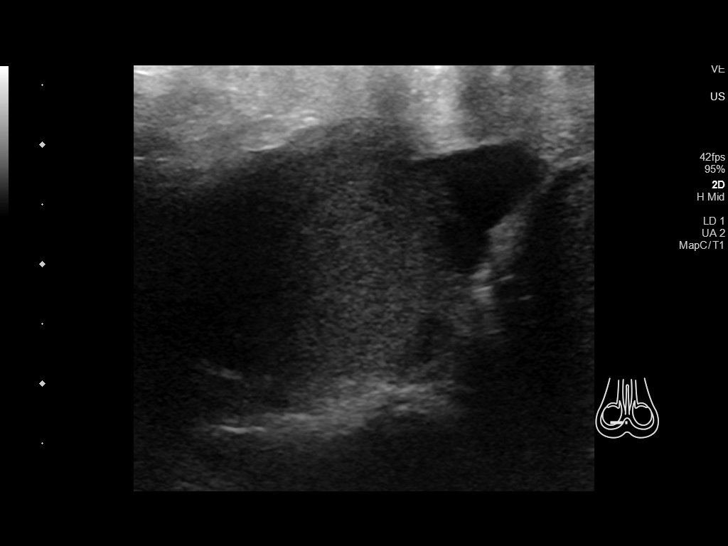
[im 31/92]
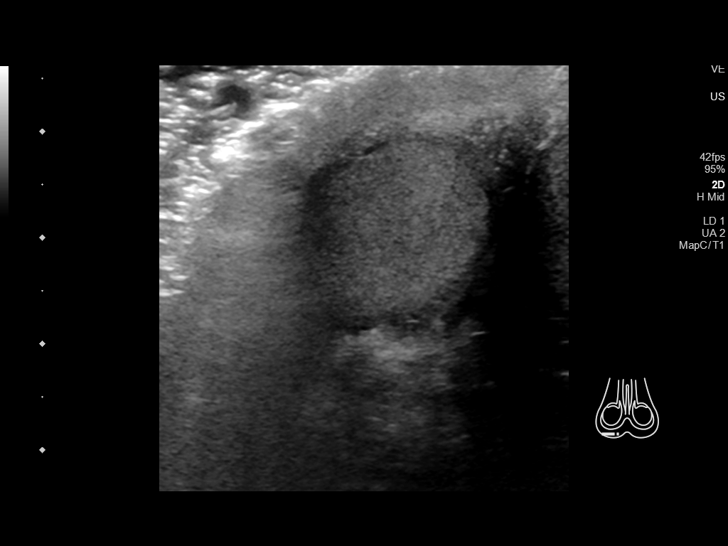
[im 38/92]
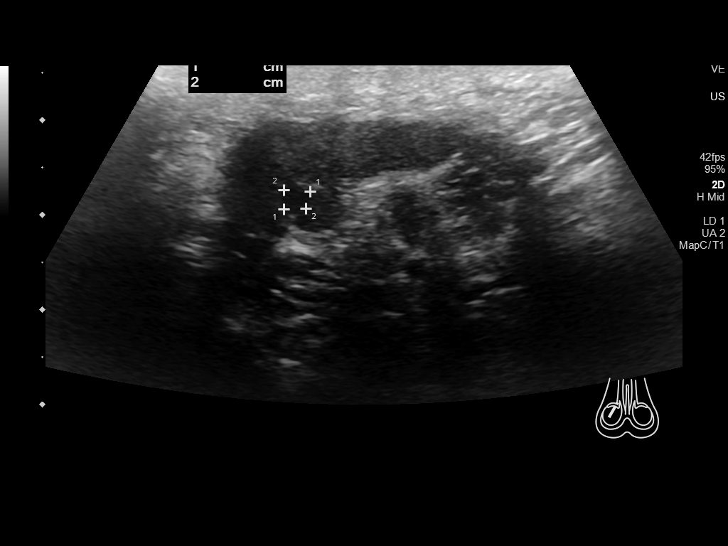
[im 46/92]
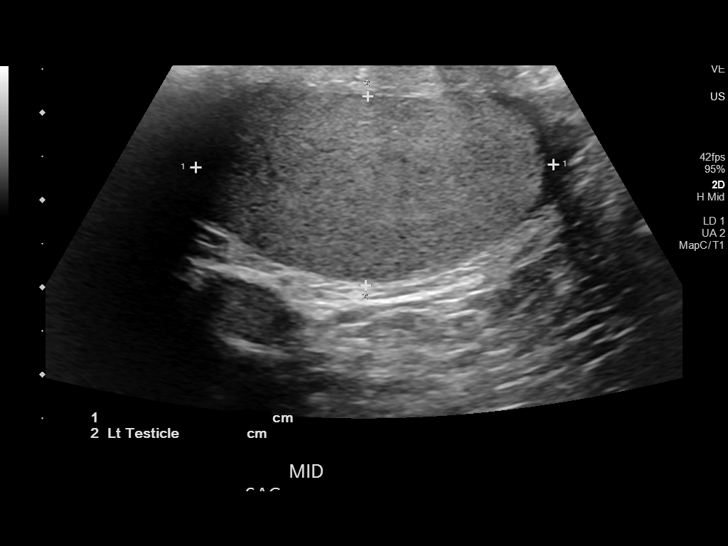
[im 54/92]
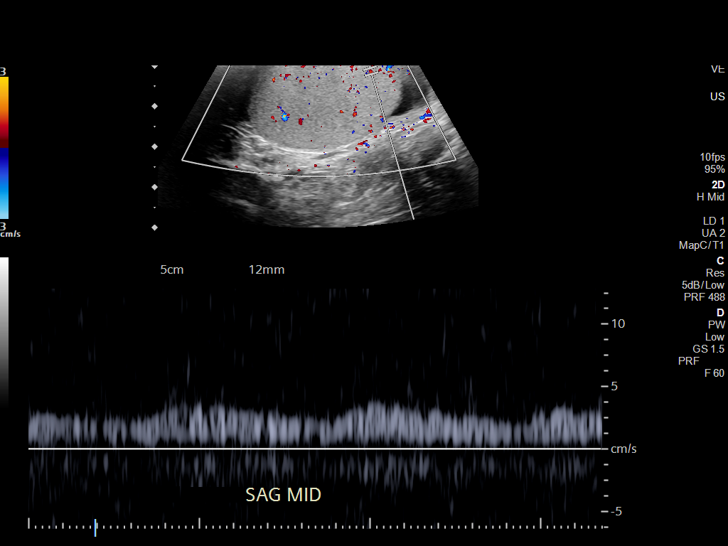
[im 61/92]
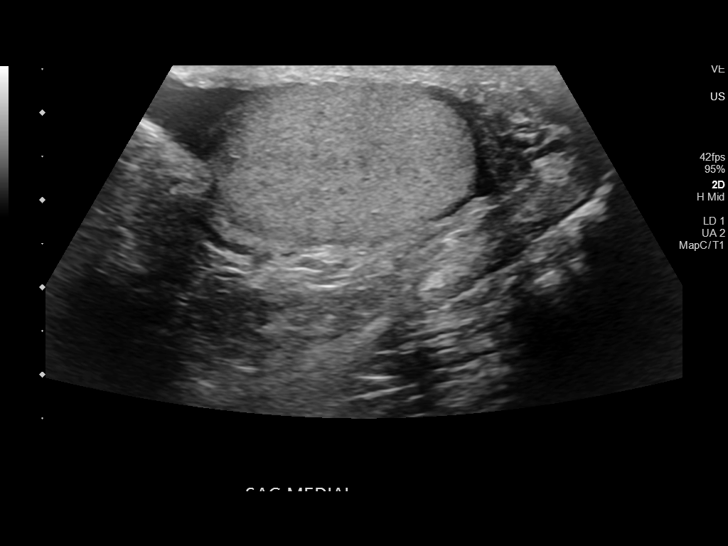
[im 69/92]
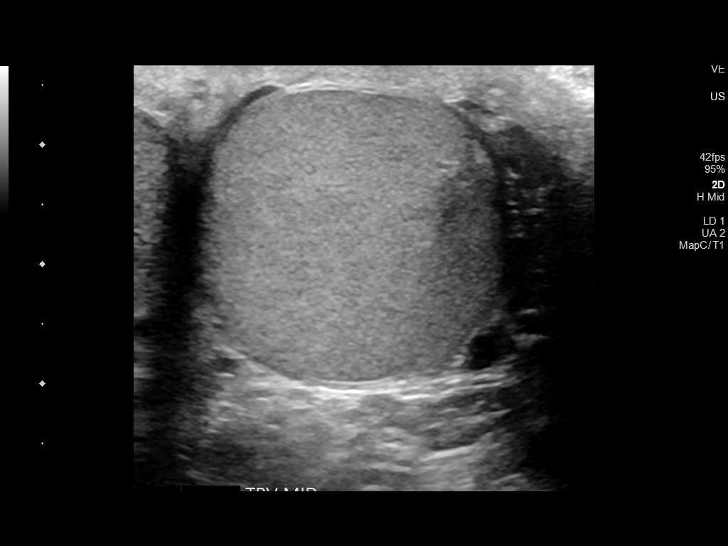
[im 76/92]
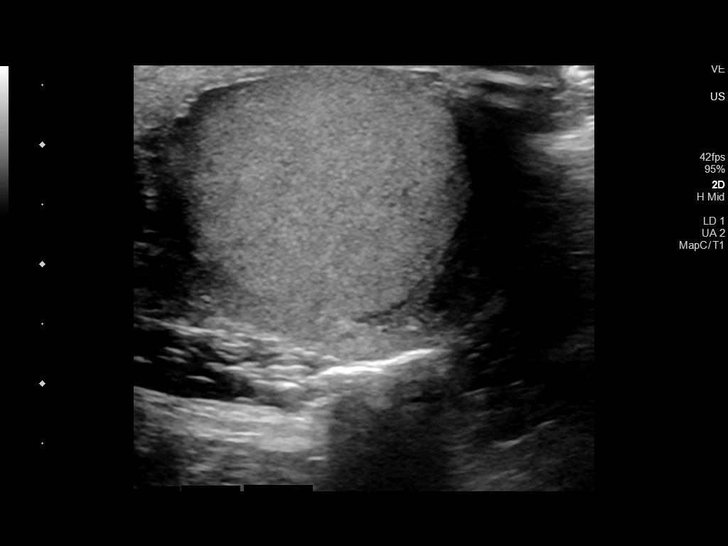
[im 84/92]
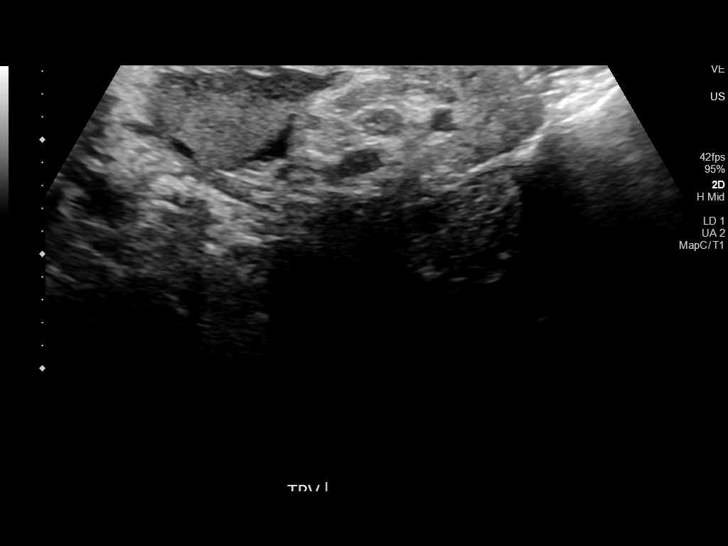
[im 92/92]
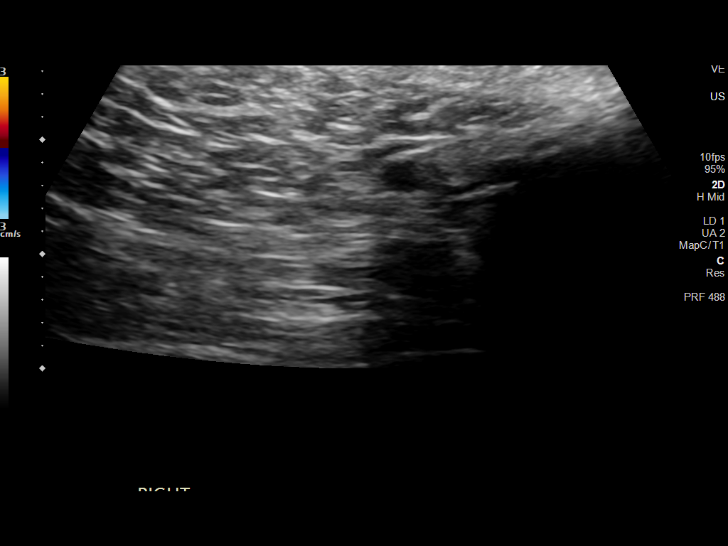

[13 of 25 positions shown; findings below may reference images not displayed]

FINDINGS: Right testicle

Measurements: Normal in size and homogeneous in echotexture
measuring 4.5 x 2.2 x 2.8 cm. No mass or microlithiasis visualized.

Left testicle

Measurements: Normal size and homogeneous in echotexture measuring
4.1 x 2.2 x 2.7 cm. No mass or microlithiasis visualized.

Right epididymis:  Small benign RIGHT epididymal cyst.

Left epididymis:  Normal

Hydrocele:  Small bilateral hydroceles

Varicocele:  None

Normal arterial and waveforms within the RIGHT testicle.

No arterial waveforms were demonstrated within the LEFT testicle.
Five different attempts were made to identify arterial waveforms
which were unsuccessful. Venous waveforms were identified

Additionally, patient was extremely tender upon examination and
clearly exhibiting considerable pain. I personally conferred with
ultrasound sound technologist.
IMPRESSION: 1. No arterial waveforms demonstrated within a normal appearing LEFT
testicle. Only venous waveforms identified on multiple attempts.
Findings concerning for early or intermittent LEFT testicular
torsion. Recommend emergent urology consultation.
2. Normal RIGHT testicle.

Findings conveyed Denis Pang on 10/16/2020  at[DATE].

## 2024-10-14 ENCOUNTER — Other Ambulatory Visit: Payer: Self-pay

## 2024-10-14 ENCOUNTER — Encounter (HOSPITAL_COMMUNITY): Payer: Self-pay | Admitting: Family Medicine

## 2024-10-14 ENCOUNTER — Observation Stay (HOSPITAL_COMMUNITY): Admission: EM | Admit: 2024-10-14 | Discharge: 2024-10-15 | Disposition: A

## 2024-10-14 ENCOUNTER — Emergency Department (HOSPITAL_COMMUNITY)

## 2024-10-14 DIAGNOSIS — R112 Nausea with vomiting, unspecified: Principal | ICD-10-CM | POA: Diagnosis present

## 2024-10-14 DIAGNOSIS — N50819 Testicular pain, unspecified: Secondary | ICD-10-CM | POA: Diagnosis present

## 2024-10-14 DIAGNOSIS — E876 Hypokalemia: Secondary | ICD-10-CM | POA: Diagnosis present

## 2024-10-14 LAB — COMPREHENSIVE METABOLIC PANEL WITH GFR
ALT: 21 U/L (ref 0–44)
AST: 29 U/L (ref 15–41)
Albumin: 4 g/dL (ref 3.5–5.0)
Alkaline Phosphatase: 58 U/L (ref 38–126)
Anion gap: 12 (ref 5–15)
BUN: 10 mg/dL (ref 6–20)
CO2: 29 mmol/L (ref 22–32)
Calcium: 8.8 mg/dL — ABNORMAL LOW (ref 8.9–10.3)
Chloride: 103 mmol/L (ref 98–111)
Creatinine, Ser: 1.18 mg/dL (ref 0.61–1.24)
GFR, Estimated: >60 mL/min (ref >60)
Glucose, Bld: 110 mg/dL — ABNORMAL HIGH (ref 70–99)
Potassium: 3.1 mmol/L — ABNORMAL LOW (ref 3.5–5.1)
Sodium: 144 mmol/L (ref 135–145)
Total Bilirubin: 1.2 mg/dL (ref 0.0–1.2)
Total Protein: 7.3 g/dL (ref 6.5–8.1)

## 2024-10-14 LAB — CBC WITH DIFFERENTIAL/PLATELET
Abs Immature Granulocytes: 0.14 K/uL — ABNORMAL HIGH (ref 0.00–0.07)
Basophils Absolute: 0.1 K/uL (ref 0.0–0.1)
Basophils Relative: 0 %
Eosinophils Absolute: 0 K/uL (ref 0.0–0.5)
Eosinophils Relative: 0 %
HCT: 44.3 % (ref 39.0–52.0)
Hemoglobin: 14.6 g/dL (ref 13.0–17.0)
Immature Granulocytes: 1 %
Lymphocytes Relative: 5 %
Lymphs Abs: 0.8 K/uL (ref 0.7–4.0)
MCH: 27.9 pg (ref 26.0–34.0)
MCHC: 33 g/dL (ref 30.0–36.0)
MCV: 84.5 fL (ref 80.0–100.0)
Monocytes Absolute: 1 K/uL (ref 0.1–1.0)
Monocytes Relative: 6 %
Neutro Abs: 15.6 K/uL — ABNORMAL HIGH (ref 1.7–7.7)
Neutrophils Relative %: 88 %
Platelets: 318 K/uL (ref 150–400)
RBC: 5.24 MIL/uL (ref 4.22–5.81)
RDW: 15.8 % — ABNORMAL HIGH (ref 11.5–15.5)
WBC: 17.6 K/uL — ABNORMAL HIGH (ref 4.0–10.5)
nRBC: 0 % (ref 0.0–0.2)

## 2024-10-14 LAB — RESP PANEL BY RT-PCR (RSV, FLU A&B, COVID)  RVPGX2
Influenza A by PCR: NEGATIVE
Influenza B by PCR: NEGATIVE
Resp Syncytial Virus by PCR: NEGATIVE
SARS Coronavirus 2 by RT PCR: NEGATIVE

## 2024-10-14 LAB — MAGNESIUM: Magnesium: 1.8 mg/dL (ref 1.7–2.4)

## 2024-10-14 LAB — LIPASE, BLOOD: Lipase: 27 U/L (ref 11–51)

## 2024-10-14 MED ORDER — DIPHENHYDRAMINE HCL 50 MG/ML IJ SOLN
25.0000 mg | Freq: Once | INTRAMUSCULAR | Status: AC
  Administered 2024-10-14: 25 mg via INTRAVENOUS
  Filled 2024-10-14: qty 1

## 2024-10-14 MED ORDER — POTASSIUM CHLORIDE IN NACL 20-0.9 MEQ/L-% IV SOLN
INTRAVENOUS | Status: DC
  Filled 2024-10-14 (×3): qty 1000

## 2024-10-14 MED ORDER — SODIUM CHLORIDE 0.9 % IV BOLUS
1000.0000 mL | Freq: Once | INTRAVENOUS | Status: AC
  Administered 2024-10-14: 1000 mL via INTRAVENOUS

## 2024-10-14 MED ORDER — METOCLOPRAMIDE HCL 5 MG/ML IJ SOLN
10.0000 mg | Freq: Once | INTRAMUSCULAR | Status: AC
  Administered 2024-10-14: 10 mg via INTRAVENOUS
  Filled 2024-10-14: qty 2

## 2024-10-14 MED ORDER — SODIUM CHLORIDE 0.9% FLUSH
3.0000 mL | Freq: Two times a day (BID) | INTRAVENOUS | Status: DC
  Administered 2024-10-14: 3 mL via INTRAVENOUS

## 2024-10-14 MED ORDER — OXYCODONE HCL 5 MG PO TABS: 5.0000 mg | ORAL_TABLET | ORAL | Status: DC | PRN

## 2024-10-14 MED ORDER — PROCHLORPERAZINE EDISYLATE 10 MG/2ML IJ SOLN
10.0000 mg | INTRAMUSCULAR | Status: DC | PRN
  Administered 2024-10-14: 10 mg via INTRAVENOUS
  Filled 2024-10-14: qty 2

## 2024-10-14 MED ORDER — ACETAMINOPHEN 325 MG PO TABS: 650.0000 mg | ORAL_TABLET | Freq: Four times a day (QID) | ORAL | Status: DC | PRN

## 2024-10-14 MED ORDER — DROPERIDOL 2.5 MG/ML IJ SOLN
1.2500 mg | Freq: Once | INTRAMUSCULAR | Status: AC
  Administered 2024-10-14: 1.25 mg via INTRAVENOUS
  Filled 2024-10-14: qty 2

## 2024-10-14 MED ORDER — ENOXAPARIN SODIUM 40 MG/0.4ML IJ SOSY
40.0000 mg | PREFILLED_SYRINGE | INTRAMUSCULAR | Status: DC
  Administered 2024-10-15: 40 mg via SUBCUTANEOUS
  Filled 2024-10-14: qty 0.4

## 2024-10-14 MED ORDER — ACETAMINOPHEN 650 MG RE SUPP: 650.0000 mg | Freq: Four times a day (QID) | RECTAL | Status: DC | PRN

## 2024-10-14 MED ORDER — POTASSIUM CHLORIDE 10 MEQ/100ML IV SOLN
10.0000 meq | Freq: Once | INTRAVENOUS | Status: AC
  Administered 2024-10-14: 10 meq via INTRAVENOUS
  Filled 2024-10-14: qty 100

## 2024-10-14 MED ORDER — PANTOPRAZOLE SODIUM 40 MG IV SOLR
40.0000 mg | Freq: Once | INTRAVENOUS | Status: AC
  Administered 2024-10-14: 40 mg via INTRAVENOUS
  Filled 2024-10-14: qty 10

## 2024-10-14 MED ORDER — FENTANYL CITRATE (PF) 50 MCG/ML IJ SOSY: 12.5000 ug | PREFILLED_SYRINGE | INTRAMUSCULAR | Status: DC | PRN

## 2024-10-14 MED ORDER — MAGNESIUM SULFATE IN D5W 1-5 GM/100ML-% IV SOLN
1.0000 g | Freq: Once | INTRAVENOUS | Status: AC
  Administered 2024-10-14: 1 g via INTRAVENOUS
  Filled 2024-10-14: qty 100

## 2024-10-14 MED ORDER — METOCLOPRAMIDE HCL 5 MG/ML IJ SOLN: 5.0000 mg | Freq: Four times a day (QID) | INTRAMUSCULAR | Status: DC | PRN

## 2024-10-14 NOTE — ED Provider Notes (Signed)
 [place] EMERGENCY DEPARTMENT AT [facility] Provider Note   CSN: [id number] Arrival date & time: 10/14/24  1647     Patient presents with: Nausea, Emesis, and Testicle Pain   Brian Dalton is a 38 y.o. male.   38 yo M with a cc of uncontrolled nausea and vomiting.  Going on for a couple days now.  He feels like he is having postnasal drip cough congestion.  After a couple days of this he developed some pain to his testicles bilaterally.  Worse with coughing.  No known sick contacts no recent travel.   Emesis Testicle Pain       Prior to Admission medications   Medication Sig Start Date End Date Taking? Authorizing Provider  famotidine (PEPCID) 40 MG tablet Take 40 mg by mouth daily.   Yes [provider]    Allergies: Doxycycline    Review of Systems  Gastrointestinal:  Positive for vomiting.  Genitourinary:  Positive for testicular pain.    Updated Vital Signs BP (!) 151/78   Pulse 80   Temp 98.2 F (36.8 C) (Oral)   Resp 20   Ht 5' 7 (1.702 m)   Wt 79.4 kg   SpO2 100%   BMI 27.41 kg/m   Physical Exam Vitals and nursing note reviewed.  Constitutional:      Appearance: He is well-developed.  HENT:     Head: Normocephalic and atraumatic.  Eyes:     Pupils: Pupils are equal, round, and reactive to light.  Neck:     Vascular: No JVD.  Cardiovascular:     Rate and Rhythm: Normal rate and regular rhythm.     Heart sounds: No murmur heard.    No friction rub. No gallop.  Pulmonary:     Effort: No respiratory distress.     Breath sounds: No wheezing.  Abdominal:     General: There is no distension.     Tenderness: There is no abdominal tenderness. There is no guarding or rebound.  Genitourinary:    Comments: No obvious testicular pain on exam.  Cremaster reflex intact bilaterally.  No obvious hernias or masses in the inguinal canal.  Circumcised. Musculoskeletal:        General: Normal range of motion.     Cervical back:  Normal range of motion and neck supple.  Skin:    Coloration: Skin is not pale.     Findings: No rash.  Neurological:     Mental Status: He is alert and oriented to person, place, and time.  Psychiatric:        Behavior: Behavior normal.     (all labs ordered are listed, but only abnormal results are displayed) Labs Reviewed  CBC WITH DIFFERENTIAL/PLATELET - Abnormal; Notable for the following components:      Result Value   WBC 17.6 (*)    RDW 15.8 (*)    Neutro Abs 15.6 (*)    Abs Immature Granulocytes 0.14 (*)    All other components within normal limits  COMPREHENSIVE METABOLIC PANEL WITH GFR - Abnormal; Notable for the following components:   Potassium 3.1 (*)    Glucose, Bld 110 (*)    Calcium 8.8 (*)    All other components within normal limits  RESP PANEL BY RT-PCR (RSV, FLU A&B, COVID)  RVPGX2  LIPASE, BLOOD  URINALYSIS, ROUTINE W REFLEX MICROSCOPIC  RAPID URINE DRUG SCREEN, HOSP PERFORMED  MAGNESIUM   HIV ANTIBODY (ROUTINE TESTING W REFLEX)  BASIC METABOLIC PANEL WITH  GFR  MAGNESIUM   CBC    EKG: EKG Interpretation Date/Time:  Tuesday October 14 2024 19:25:50 EST Ventricular Rate:  82 PR Interval:  140 QRS Duration:  80 QT Interval:  384 QTC Calculation: 449 R Axis:   -4  Text Interpretation: Sinus rhythm Partial missing lead(s): V2 No old tracing to compare Confirmed by Emil Share [phone]) on 10/14/2024 7:38:05 PM  Radiology: US  SCROTUM W/DOPPLER Result Date: 10/14/2024 EXAM: ULTRASOUND SCROTUM/TESTICLES WITH DOPPLER FLOW EVALUATION 10/14/2024 05:39:31 PM TECHNIQUE: Duplex ultrasound using B-mode/gray scaled imaging, Doppler spectral analysis and color flow Doppler was obtained of the testicles. COMPARISON: None available. CLINICAL HISTORY: testicular pain FINDINGS: RIGHT: GREY SCALE: The right testicle measures 4.6 x 1.7 x 2.9 cm. It demonstrates normal homogeneous echotexture without focal lesion. No testicular microlithiasis. DOPPLER EVALUATION: There is  normal vascularity on color Doppler examination of the testes. Spectral Doppler arterial and venous waveforms are normal. VARICOCELE: No scrotal varicocele. SCROTAL SAC: No hydrocele. EPIDIDYMIS: Right epididymal cyst measures 4 mm. LEFT: GREY SCALE: The left testicle measures 4.3 x 1.7 x 2.7 cm. It demonstrates normal homogeneous echotexture without focal lesion. No testicular microlithiasis. DOPPLER EVALUATION: There is normal vascularity on color Doppler examination of the testes. Spectral Doppler arterial and venous waveforms are normal. VARICOCELE: No scrotal varicocele. SCROTAL SAC: No hydrocele. EPIDIDYMIS: Multiple small left epididymal cysts measuring up to 5 mm. IMPRESSION: 1. Normal testes with normal Doppler flow. No evidence of torsion or other acute abnormality. 2. Subcentimeter bilateral epididymal cysts, not clinically significant. Electronically signed by: Franky Crease MD 10/14/2024 06:06 PM EST RP Workstation: [id number]     .Critical Care  Performed by: Emil Share, DO Authorized by: Emil Share, DO   Critical care provider statement:    Critical care time (minutes):  35   Critical care time was exclusive of:  Separately billable procedures and treating other patients   Critical care was time spent personally by me on the following activities:  Development of treatment plan with patient or surrogate, discussions with consultants, evaluation of patient's response to treatment, examination of patient, ordering and review of laboratory studies, ordering and review of radiographic studies, ordering and performing treatments and interventions, pulse oximetry, re-evaluation of patient's condition and review of old charts   Care discussed with: admitting provider      Medications Ordered in the ED  potassium chloride  10 mEq in 100 mL IVPB (10 mEq Intravenous New Bag/Given 10/14/24 2013)  enoxaparin  (LOVENOX ) injection 40 mg (has no administration in time range)  pantoprazole  (PROTONIX )  injection 40 mg (has no administration in time range)  prochlorperazine  (COMPAZINE ) injection 10 mg (has no administration in time range)  metoCLOPramide  (REGLAN ) injection 5 mg (has no administration in time range)  sodium chloride  flush (NS) 0.9 % injection 3 mL (has no administration in time range)  acetaminophen  (TYLENOL ) tablet 650 mg (has no administration in time range)    Or  acetaminophen  (TYLENOL ) suppository 650 mg (has no administration in time range)  fentaNYL  (SUBLIMAZE ) injection 12.5-50 mcg (has no administration in time range)  oxyCODONE  (Oxy IR/ROXICODONE ) immediate release tablet 5 mg (has no administration in time range)  0.9 % NaCl with KCl 20 mEq/ L  infusion (has no administration in time range)  sodium chloride  0.9 % bolus 1,000 mL (1,000 mLs Intravenous New Bag/Given 10/14/24 1747)  metoCLOPramide  (REGLAN ) injection 10 mg (10 mg Intravenous Given 10/14/24 1707)  diphenhydrAMINE  (BENADRYL ) injection 25 mg (25 mg Intravenous Given 10/14/24 1707)  droperidol  (INAPSINE ) 2.5 MG/ML injection  1.25 mg (1.25 mg Intravenous Given 10/14/24 1810)  diphenhydrAMINE  (BENADRYL ) injection 25 mg (25 mg Intravenous Given 10/14/24 1810)  sodium chloride  0.9 % bolus 1,000 mL (1,000 mLs Intravenous New Bag/Given 10/14/24 1858)  droperidol  (INAPSINE ) 2.5 MG/ML injection 1.25 mg (1.25 mg Intravenous Given 10/14/24 1918)                                    Medical Decision Making Amount and/or Complexity of Data Reviewed Labs: ordered. Radiology: ordered. ECG/medicine tests: ordered.  Risk Prescription drug management. Decision regarding hospitalization.   38 yo M with a chief complaints of what sounds like a viral syndrome cough congestion nausea vomiting going on for about 24 to 48 hours.  He started developing some testicular pain especially with coughing and called EMS to come to the hospital.  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  Patient's calcium is mildly low at  3.1.  Leukocytosis of 17,000 with a neutrophilic predominance.  Patient is feeling a bit better but had required multiple rounds of antiemetics.  Will discuss with medicine for observation.  Covid flu rsv negative.  The patients results and plan were reviewed and discussed.   Any x-rays performed were independently reviewed by myself.   Differential diagnosis were considered with the presenting HPI.  Medications  potassium chloride  10 mEq in 100 mL IVPB (10 mEq Intravenous New Bag/Given 10/14/24 2013)  enoxaparin  (LOVENOX ) injection 40 mg (has no administration in time range)  pantoprazole  (PROTONIX ) injection 40 mg (has no administration in time range)  prochlorperazine  (COMPAZINE ) injection 10 mg (has no administration in time range)  metoCLOPramide  (REGLAN ) injection 5 mg (has no administration in time range)  sodium chloride  flush (NS) 0.9 % injection 3 mL (has no administration in time range)  acetaminophen  (TYLENOL ) tablet 650 mg (has no administration in time range)    Or  acetaminophen  (TYLENOL ) suppository 650 mg (has no administration in time range)  fentaNYL  (SUBLIMAZE ) injection 12.5-50 mcg (has no administration in time range)  oxyCODONE  (Oxy IR/ROXICODONE ) immediate release tablet 5 mg (has no administration in time range)  0.9 % NaCl with KCl 20 mEq/ L  infusion (has no administration in time range)  sodium chloride  0.9 % bolus 1,000 mL (1,000 mLs Intravenous New Bag/Given 10/14/24 1747)  metoCLOPramide  (REGLAN ) injection 10 mg (10 mg Intravenous Given 10/14/24 1707)  diphenhydrAMINE  (BENADRYL ) injection 25 mg (25 mg Intravenous Given 10/14/24 1707)  droperidol  (INAPSINE ) 2.5 MG/ML injection 1.25 mg (1.25 mg Intravenous Given 10/14/24 1810)  diphenhydrAMINE  (BENADRYL ) injection 25 mg (25 mg Intravenous Given 10/14/24 1810)  sodium chloride  0.9 % bolus 1,000 mL (1,000 mLs Intravenous New Bag/Given 10/14/24 1858)  droperidol  (INAPSINE ) 2.5 MG/ML injection 1.25 mg (1.25 mg  Intravenous Given 10/14/24 1918)    Vitals:   10/14/24 1651 10/14/24 1652 10/14/24 1700 10/14/24 1930  BP:  137/82 128/80 (!) 151/78  Pulse:  (!) 53 (!) 51 80  Resp:  17 18 20   Temp:  98.2 F (36.8 C)    TempSrc:  Oral    SpO2:  100% 100% 100%  Weight: 79.4 kg     Height: 5' 7 (1.702 m)       Final diagnoses:  Intractable nausea and vomiting    Admission/ observation were discussed with the admitting physician, patient and/or family and they are comfortable with the plan.        Final diagnoses:  Intractable nausea and vomiting    ED Discharge Orders     None          Emil Share, [place] 10/14/24 2040

## 2024-10-14 NOTE — ED Triage Notes (Signed)
 Pt BIB [facility] EMS from home. Pt has been having nausea and vomiting for 1 days. Pt began having testicle pain today that felt like tugging. Hx of testicular torsion. EMS gave zofran  and 100 mcg fentanyl  en route.

## 2024-10-14 NOTE — ED Notes (Signed)
 Patient transported to Ultrasound

## 2024-10-14 NOTE — H&P (Signed)
 History and Physical    Brian Dalton FMW:[id number] DOB: 30-Nov-1985 DOA: 10/14/2024  PCP: Burney Darice CROME, MD   Patient coming from: Home   Chief Complaint: N/V, left testicle pain   HPI: Brian Dalton is a 38 y.o. male with medical history significant for testicular torsion status post bilateral orchiopexy in 2021 who now presents with nausea, vomiting, and pain in the left testicle.   Patient reports that he was in his usual state of health on 10/12/2024 but then developed rhinorrhea, congestion, sore throat, and nausea.  He has had numerous bouts of vomiting since yesterday.  He then developed discomfort in the left testicle.  He denies abdominal pain, diarrhea, or known sick contacts.  ED Course: Upon arrival to the ED, patient is found to be afebrile and saturating well on room air with normal RR, normal HR, and stable BP.  Labs are most notable for potassium 3.1, normal LFTs and lipase, and WBC 17,600.  Scrotal ultrasound is normal with no evidence for torsion or other acute abnormality.  Patient was treated in the ED with 2 L NS, IV potassium, Reglan , Benadryl  x 2, and droperidol .  He has had some improvement but continues to have nausea, vomiting, and inability to tolerate anything by mouth.  Review of Systems:  All other systems reviewed and apart from HPI, are negative.  History reviewed. No pertinent past medical history.  Past Surgical History:  Procedure Laterality Date   ORCHIECTOMY Bilateral 10/16/2020   Procedure: scrotal exploration bilateral scrotal orchiopexy;  Surgeon: Elisabeth Valli BIRCH, MD;  Location: [facility] ORS;  Service: Urology;  Laterality: Bilateral;    Social History:   reports that he has quit smoking. He has never used smokeless tobacco. He reports current alcohol use. He reports current drug use. Drug: Marijuana.  Allergies  Allergen Reactions   Doxycycline Rash    History reviewed. No pertinent family history.   Prior to Admission  medications   Medication Sig Start Date End Date Taking? Authorizing Provider  famotidine (PEPCID) 40 MG tablet Take 40 mg by mouth daily.   Yes [provider]    Physical Exam: Vitals:   10/14/24 1651 10/14/24 1652 10/14/24 1700 10/14/24 1930  BP:  137/82 128/80 (!) 151/78  Pulse:  (!) 53 (!) 51 80  Resp:  17 18 20   Temp:  98.2 F (36.8 C)    TempSrc:  Oral    SpO2:  100% 100% 100%  Weight: 79.4 kg     Height: 5' 7 (1.702 m)       Constitutional: NAD, no pallor or diaphoresis, sitting up and clutching emesis bag   Eyes: PERTLA, lids and conjunctivae normal ENMT: Mucous membranes are moist. Posterior pharynx clear of any exudate or lesions.   Neck: supple, no masses  Respiratory: no wheezing, no crackles. No accessory muscle use.  Cardiovascular: S1 & S2 heard, regular rate and rhythm. No extremity edema.   Abdomen: Soft, non-tender. Bowel sounds active.  Musculoskeletal: no clubbing / cyanosis. No joint deformity upper and lower extremities.   Skin: no significant rashes, lesions, ulcers. Warm, dry, well-perfused. Neurologic: CN 2-12 grossly intact. Moving all extremities. Alert and oriented.  Psychiatric: Calm. Cooperative.    Labs and Imaging on Admission: I have personally reviewed following labs and imaging studies  CBC: Recent Labs  Lab 10/14/24 1657  WBC 17.6*  NEUTROABS 15.6*  HGB 14.6  HCT 44.3  MCV 84.5  PLT 318   Basic Metabolic Panel: Recent Labs  Lab 10/14/24 1657  NA 144  K 3.1*  CL 103  CO2 29  GLUCOSE 110*  BUN 10  CREATININE 1.18  CALCIUM 8.8*   GFR: Estimated Creatinine Clearance: 87.4 mL/min (by C-G formula based on SCr of 1.18 mg/dL). Liver Function Tests: Recent Labs  Lab 10/14/24 1657  AST 29  ALT 21  ALKPHOS 58  BILITOT 1.2  PROT 7.3  ALBUMIN 4.0   Recent Labs  Lab 10/14/24 1657  LIPASE 27   No results for input(s): AMMONIA in the last 168 hours. Coagulation Profile: No results for input(s): INR,  PROTIME in the last 168 hours. Cardiac Enzymes: No results for input(s): CKTOTAL, CKMB, CKMBINDEX, TROPONINI in the last 168 hours. BNP (last 3 results) No results for input(s): PROBNP in the last 8760 hours. HbA1C: No results for input(s): HGBA1C in the last 72 hours. CBG: No results for input(s): GLUCAP in the last 168 hours. Lipid Profile: No results for input(s): CHOL, HDL, LDLCALC, TRIG, CHOLHDL, LDLDIRECT in the last 72 hours. Thyroid Function Tests: No results for input(s): TSH, T4TOTAL, FREET4, T3FREE, THYROIDAB in the last 72 hours. Anemia Panel: No results for input(s): VITAMINB12, FOLATE, FERRITIN, TIBC, IRON, RETICCTPCT in the last 72 hours. Urine analysis: No results found for: COLORURINE, APPEARANCEUR, LABSPEC, PHURINE, GLUCOSEU, HGBUR, BILIRUBINUR, KETONESUR, PROTEINUR, UROBILINOGEN, NITRITE, LEUKOCYTESUR Sepsis Labs: @LABRCNTIP (procalcitonin:4,lacticidven:4) ) Recent Results (from the past 240 hours)  Resp panel by RT-PCR (RSV, Flu A&B, Covid) Anterior Nasal Swab     Status: None   Collection Time: 10/14/24  5:43 PM   Specimen: Anterior Nasal Swab  Result Value Ref Range Status   SARS Coronavirus 2 by RT PCR NEGATIVE NEGATIVE Final   Influenza A by PCR NEGATIVE NEGATIVE Final   Influenza B by PCR NEGATIVE NEGATIVE Final    Comment: (NOTE) The Xpert Xpress SARS-CoV-2/FLU/RSV plus assay is intended as an aid in the diagnosis of influenza from Nasopharyngeal swab specimens and should not be used as a sole basis for treatment. Nasal washings and aspirates are unacceptable for Xpert Xpress SARS-CoV-2/FLU/RSV testing.  Fact Sheet for Patients: bloggercourse.com  Fact Sheet for Healthcare Providers: seriousbroker.it  This test is not yet approved or cleared by the [place]  FDA and has been authorized for detection and/or diagnosis of  SARS-CoV-2 by FDA under an Emergency Use Authorization (EUA). This EUA will remain in effect (meaning this test can be used) for the duration of the COVID-19 declaration under Section 564(b)(1) of the Act, 21 U.S.C. section 360bbb-3(b)(1), unless the authorization is terminated or revoked.     Resp Syncytial Virus by PCR NEGATIVE NEGATIVE Final    Comment: (NOTE) Fact Sheet for Patients: bloggercourse.com  Fact Sheet for Healthcare Providers: seriousbroker.it  This test is not yet approved or cleared by the [place]  FDA and has been authorized for detection and/or diagnosis of SARS-CoV-2 by FDA under an Emergency Use Authorization (EUA). This EUA will remain in effect (meaning this test can be used) for the duration of the COVID-19 declaration under Section 564(b)(1) of the Act, 21 U.S.C. section 360bbb-3(b)(1), unless the authorization is terminated or revoked.  Performed at [facility], [address], [place], [place] [address]      Radiological Exams on Admission: US  SCROTUM W/DOPPLER Result Date: 10/14/2024 EXAM: ULTRASOUND SCROTUM/TESTICLES WITH DOPPLER FLOW EVALUATION 10/14/2024 05:39:31 PM TECHNIQUE: Duplex ultrasound using B-mode/gray scaled imaging, Doppler spectral analysis and color flow Doppler was obtained of the testicles. COMPARISON: None available. CLINICAL HISTORY: testicular pain FINDINGS: RIGHT: GREY  SCALE: The right testicle measures 4.6 x 1.7 x 2.9 cm. It demonstrates normal homogeneous echotexture without focal lesion. No testicular microlithiasis. DOPPLER EVALUATION: There is normal vascularity on color Doppler examination of the testes. Spectral Doppler arterial and venous waveforms are normal. VARICOCELE: No scrotal varicocele. SCROTAL SAC: No hydrocele. EPIDIDYMIS: Right epididymal cyst measures 4 mm. LEFT: GREY SCALE: The left testicle measures 4.3 x 1.7 x 2.7 cm. It demonstrates normal homogeneous  echotexture without focal lesion. No testicular microlithiasis. DOPPLER EVALUATION: There is normal vascularity on color Doppler examination of the testes. Spectral Doppler arterial and venous waveforms are normal. VARICOCELE: No scrotal varicocele. SCROTAL SAC: No hydrocele. EPIDIDYMIS: Multiple small left epididymal cysts measuring up to 5 mm. IMPRESSION: 1. Normal testes with normal Doppler flow. No evidence of torsion or other acute abnormality. 2. Subcentimeter bilateral epididymal cysts, not clinically significant. Electronically signed by: Franky Crease MD 10/14/2024 06:06 PM EST RP Workstation: [id number]    EKG: Independently reviewed. Sinus rhythm.   Assessment/Plan   1. Intractable N/V  - Abdominal exam benign, LFTs and lipase are normal, and this is most likely d/t viral illness given URI sxs  - Continue supportive care with IVF hydration, monitor/correct electrolytes, and advance diet as he improves    2. Testicle pain  - Normal testes and normal Doppler flow noted on US   - Currently asymptomatic    3. Hypokalemia  - Replacing, will repeat chem panel in am     DVT prophylaxis: Lovenox   Code Status: Full  Level of Care: Level of care: Telemetry Family Communication: Friend at bedside  Disposition Plan:  Patient is from: Home  Anticipated d/c is to: Home  Anticipated d/c date is: 12/10 or 10/16/24  Patient currently: Pending tolerance of adequate oral intake  Consults called: None  Admission status: Observation     Evalene GORMAN Sprinkles, MD [facility]  10/14/2024, 8:33 PM

## 2024-10-15 ENCOUNTER — Other Ambulatory Visit: Payer: Self-pay

## 2024-10-15 DIAGNOSIS — D72829 Elevated white blood cell count, unspecified: Secondary | ICD-10-CM

## 2024-10-15 DIAGNOSIS — R112 Nausea with vomiting, unspecified: Secondary | ICD-10-CM | POA: Diagnosis not present

## 2024-10-15 DIAGNOSIS — E876 Hypokalemia: Secondary | ICD-10-CM

## 2024-10-15 LAB — URINALYSIS, ROUTINE W REFLEX MICROSCOPIC
Bacteria, UA: NONE SEEN
Bilirubin Urine: NEGATIVE
Glucose, UA: NEGATIVE mg/dL
Hgb urine dipstick: NEGATIVE
Ketones, ur: 80 mg/dL — AB
Leukocytes,Ua: NEGATIVE
Nitrite: NEGATIVE
Protein, ur: 30 mg/dL — AB
Specific Gravity, Urine: 1.03 (ref 1.005–1.030)
pH: 7 (ref 5.0–8.0)

## 2024-10-15 LAB — CBC
HCT: 41.2 % (ref 39.0–52.0)
Hemoglobin: 13.5 g/dL (ref 13.0–17.0)
MCH: 27.9 pg (ref 26.0–34.0)
MCHC: 32.8 g/dL (ref 30.0–36.0)
MCV: 85.1 fL (ref 80.0–100.0)
Platelets: 303 K/uL (ref 150–400)
RBC: 4.84 MIL/uL (ref 4.22–5.81)
RDW: 15.9 % — ABNORMAL HIGH (ref 11.5–15.5)
WBC: 17.7 K/uL — ABNORMAL HIGH (ref 4.0–10.5)
nRBC: 0 % (ref 0.0–0.2)

## 2024-10-15 LAB — BASIC METABOLIC PANEL WITH GFR
Anion gap: 8 (ref 5–15)
Anion gap: 9 (ref 5–15)
BUN: 10 mg/dL (ref 6–20)
BUN: 7 mg/dL (ref 6–20)
CO2: 29 mmol/L (ref 22–32)
CO2: 27 mmol/L (ref 22–32)
Calcium: 8.3 mg/dL — ABNORMAL LOW (ref 8.9–10.3)
Calcium: 8.6 mg/dL — ABNORMAL LOW (ref 8.9–10.3)
Chloride: 108 mmol/L (ref 98–111)
Chloride: 107 mmol/L (ref 98–111)
Creatinine, Ser: 1.06 mg/dL (ref 0.61–1.24)
Creatinine, Ser: 1.22 mg/dL (ref 0.61–1.24)
GFR, Estimated: >60 mL/min (ref >60)
GFR, Estimated: >60 mL/min (ref >60)
Glucose, Bld: 131 mg/dL — ABNORMAL HIGH (ref 70–99)
Glucose, Bld: 131 mg/dL — ABNORMAL HIGH (ref 70–99)
Potassium: 3.4 mmol/L — ABNORMAL LOW (ref 3.5–5.1)
Potassium: 3.3 mmol/L — ABNORMAL LOW (ref 3.5–5.1)
Sodium: 145 mmol/L (ref 135–145)
Sodium: 143 mmol/L (ref 135–145)

## 2024-10-15 LAB — RAPID URINE DRUG SCREEN, HOSP PERFORMED
Amphetamines: NOT DETECTED
Barbiturates: NOT DETECTED
Benzodiazepines: NOT DETECTED
Cocaine: NOT DETECTED
Opiates: NOT DETECTED
Tetrahydrocannabinol: POSITIVE — AB

## 2024-10-15 LAB — MAGNESIUM
Magnesium: 1.7 mg/dL (ref 1.7–2.4)
Magnesium: 2.3 mg/dL (ref 1.7–2.4)

## 2024-10-15 LAB — HIV ANTIBODY (ROUTINE TESTING W REFLEX): HIV Screen 4th Generation wRfx: NONREACTIVE

## 2024-10-15 MED ORDER — LACTATED RINGERS IV SOLN: INTRAVENOUS | Status: DC

## 2024-10-15 MED ORDER — POTASSIUM CHLORIDE CRYS ER 20 MEQ PO TBCR
40.0000 meq | EXTENDED_RELEASE_TABLET | Freq: Once | ORAL | Status: AC
  Administered 2024-10-15: 40 meq via ORAL
  Filled 2024-10-15: qty 2

## 2024-10-15 MED ORDER — CLONAZEPAM 0.25 MG PO TBDP
1.0000 mg | ORAL_TABLET | Freq: Three times a day (TID) | ORAL | Status: DC | PRN
  Administered 2024-10-15: 1 mg via ORAL
  Filled 2024-10-15: qty 4

## 2024-10-15 NOTE — Progress Notes (Deleted)
 PROGRESS NOTE    Brian Dalton  FMW:[id number] DOB: 1986/08/07 DOA: 10/14/2024 PCP: Burney Darice CROME, MD  Subjective:  No acute events overnight. Seen and examined at bedside. Reports feeling slightly better. Reports occasional nausea but no vomiting. Denies constipation.    Hospital Course:  As per H&P: 38 y.o. male with medical history significant for testicular torsion status post bilateral orchiopexy in 2021 who now presents with nausea, vomiting, and pain in the left testicle.    Patient reports that he was in his usual state of health on 10/12/2024 but then developed rhinorrhea, congestion, sore throat, and nausea.  He has had numerous bouts of vomiting since yesterday.  He then developed discomfort in the left testicle.  He denies abdominal pain, diarrhea, or known sick contacts.   Assessment and Plan:  Intractable nausea and vomiting Possible cannabis hyperemesis syndrome - abdominal exam benign - respiratory viral panel negative - LFTs and lipase unremarkable - Utox positive for THC - Cont IV fluids. Stop if tolerating PO well - counseled on cessation from recreational cannabis use - encourage oral intake  Hypokalemia - monitor and replete electrolytes as needed - monitor Mg  Testicular pain - hx of testicular torsion s/p bilateral orchiopexy - transient in nature - US  scrotum unremarkable  Anxiety - resume home clonazepam  PRN  DVT prophylaxis: enoxaparin  (LOVENOX ) injection 40 mg Start: 10/15/24 1000  SCDs   Code Status: Full Code Disposition Plan: Home Reason for continuing need for hospitalization: severity of illness, inability to tolerate PO well  Objective: Vitals:   10/14/24 2057 10/14/24 2351 10/15/24 0803 10/15/24 1121  BP: 130/71 128/61 122/76 (!) 140/86  Pulse: (!) 54 (!) 59 62 (!) 49  Resp: 17 19 16 16   Temp: 98.4 F (36.9 C) 98.6 F (37 C) 98.7 F (37.1 C) 98.6 F (37 C)  TempSrc:  Oral Oral Oral  SpO2: 100% 97% 97% 100%   Weight:      Height:        Intake/Output Summary (Last 24 hours) at 10/15/2024 1307 Last data filed at 10/15/2024 0334 Gross per 24 hour  Intake 763.38 ml  Output 300 ml  Net 463.38 ml   Filed Weights   10/14/24 1651  Weight: 79.4 kg    Examination:  Physical Exam Vitals and nursing note reviewed.  Constitutional:      General: He is not in acute distress.    Appearance: He is ill-appearing.  HENT:     Head: Normocephalic and atraumatic.  Cardiovascular:     Rate and Rhythm: Normal rate and regular rhythm.     Pulses: Normal pulses.     Heart sounds: Normal heart sounds.  Pulmonary:     Effort: Pulmonary effort is normal.     Breath sounds: Normal breath sounds.  Abdominal:     General: Bowel sounds are normal. There is no distension.     Palpations: Abdomen is soft.     Tenderness: There is no abdominal tenderness.  Neurological:     Mental Status: He is alert. Mental status is at baseline.     Data Reviewed: I have personally reviewed following labs and imaging studies  CBC: Recent Labs  Lab 10/14/24 1657 10/14/24 2112  WBC 17.6* 17.7*  NEUTROABS 15.6*  --   HGB 14.6 13.5  HCT 44.3 41.2  MCV 84.5 85.1  PLT 318 303   Basic Metabolic Panel: Recent Labs  Lab 10/14/24 1657 10/14/24 2112 10/15/24 1110  NA 144 145 143  K  3.1* 3.4* 3.3*  CL 103 108 107  CO2 29 29 27   GLUCOSE 110* 131* 131*  BUN 10 10 7   CREATININE 1.18 1.06 1.22  CALCIUM 8.8* 8.3* 8.6*  MG  --  1.7  1.8  --    GFR: Estimated Creatinine Clearance: 84.5 mL/min (by C-G formula based on SCr of 1.22 mg/dL). Liver Function Tests: Recent Labs  Lab 10/14/24 1657  AST 29  ALT 21  ALKPHOS 58  BILITOT 1.2  PROT 7.3  ALBUMIN 4.0   Recent Labs  Lab 10/14/24 1657  LIPASE 27   No results for input(s): AMMONIA in the last 168 hours. Coagulation Profile: No results for input(s): INR, PROTIME in the last 168 hours. Cardiac Enzymes: No results for input(s): CKTOTAL,  CKMB, CKMBINDEX, TROPONINI in the last 168 hours. ProBNP, BNP (last 5 results) No results for input(s): PROBNP, BNP in the last 8760 hours. HbA1C: No results for input(s): HGBA1C in the last 72 hours. CBG: No results for input(s): GLUCAP in the last 168 hours. Lipid Profile: No results for input(s): CHOL, HDL, LDLCALC, TRIG, CHOLHDL, LDLDIRECT in the last 72 hours. Thyroid Function Tests: No results for input(s): TSH, T4TOTAL, FREET4, T3FREE, THYROIDAB in the last 72 hours. Anemia Panel: No results for input(s): VITAMINB12, FOLATE, FERRITIN, TIBC, IRON, RETICCTPCT in the last 72 hours. Sepsis Labs: No results for input(s): PROCALCITON, LATICACIDVEN in the last 168 hours.  Recent Results (from the past 240 hours)  Resp panel by RT-PCR (RSV, Flu A&B, Covid) Anterior Nasal Swab     Status: None   Collection Time: 10/14/24  5:43 PM   Specimen: Anterior Nasal Swab  Result Value Ref Range Status   SARS Coronavirus 2 by RT PCR NEGATIVE NEGATIVE Final   Influenza A by PCR NEGATIVE NEGATIVE Final   Influenza B by PCR NEGATIVE NEGATIVE Final    Comment: (NOTE) The Xpert Xpress SARS-CoV-2/FLU/RSV plus assay is intended as an aid in the diagnosis of influenza from Nasopharyngeal swab specimens and should not be used as a sole basis for treatment. Nasal washings and aspirates are unacceptable for Xpert Xpress SARS-CoV-2/FLU/RSV testing.  Fact Sheet for Patients: bloggercourse.com  Fact Sheet for Healthcare Providers: seriousbroker.it  This test is not yet approved or cleared by the [place]  FDA and has been authorized for detection and/or diagnosis of SARS-CoV-2 by FDA under an Emergency Use Authorization (EUA). This EUA will remain in effect (meaning this test can be used) for the duration of the COVID-19 declaration under Section 564(b)(1) of the Act, 21 U.S.C. section  360bbb-3(b)(1), unless the authorization is terminated or revoked.     Resp Syncytial Virus by PCR NEGATIVE NEGATIVE Final    Comment: (NOTE) Fact Sheet for Patients: bloggercourse.com  Fact Sheet for Healthcare Providers: seriousbroker.it  This test is not yet approved or cleared by the [place]  FDA and has been authorized for detection and/or diagnosis of SARS-CoV-2 by FDA under an Emergency Use Authorization (EUA). This EUA will remain in effect (meaning this test can be used) for the duration of the COVID-19 declaration under Section 564(b)(1) of the Act, 21 U.S.C. section 360bbb-3(b)(1), unless the authorization is terminated or revoked.  Performed at [facility], [address], [place], [place] [address]      Radiology Studies: US  SCROTUM W/DOPPLER Result Date: 10/14/2024 EXAM: ULTRASOUND SCROTUM/TESTICLES WITH DOPPLER FLOW EVALUATION 10/14/2024 05:39:31 PM TECHNIQUE: Duplex ultrasound using B-mode/gray scaled imaging, Doppler spectral analysis and color flow Doppler was obtained of the testicles. COMPARISON:  None available. CLINICAL HISTORY: testicular pain FINDINGS: RIGHT: GREY SCALE: The right testicle measures 4.6 x 1.7 x 2.9 cm. It demonstrates normal homogeneous echotexture without focal lesion. No testicular microlithiasis. DOPPLER EVALUATION: There is normal vascularity on color Doppler examination of the testes. Spectral Doppler arterial and venous waveforms are normal. VARICOCELE: No scrotal varicocele. SCROTAL SAC: No hydrocele. EPIDIDYMIS: Right epididymal cyst measures 4 mm. LEFT: GREY SCALE: The left testicle measures 4.3 x 1.7 x 2.7 cm. It demonstrates normal homogeneous echotexture without focal lesion. No testicular microlithiasis. DOPPLER EVALUATION: There is normal vascularity on color Doppler examination of the testes. Spectral Doppler arterial and venous waveforms are normal. VARICOCELE: No scrotal  varicocele. SCROTAL SAC: No hydrocele. EPIDIDYMIS: Multiple small left epididymal cysts measuring up to 5 mm. IMPRESSION: 1. Normal testes with normal Doppler flow. No evidence of torsion or other acute abnormality. 2. Subcentimeter bilateral epididymal cysts, not clinically significant. Electronically signed by: Franky Crease MD 10/14/2024 06:06 PM EST RP Workstation: [id number]    Scheduled Meds:  enoxaparin  (LOVENOX ) injection  40 mg Subcutaneous Q24H   potassium chloride   40 mEq Oral Once   sodium chloride  flush  3 mL Intravenous Q12H   Continuous Infusions:  lactated ringers        LOS: 0 days   Norval Bar, MD  [facility]  10/15/2024, 1:07 PM

## 2024-10-15 NOTE — Discharge Instructions (Signed)
 Follow up with PCP within one week Refrain from recreational drug use Get repeat blood work in 1 week

## 2024-10-15 NOTE — Discharge Summary (Signed)
 Physician Discharge Summary   Patient: Brian Dalton MRN: [id number] DOB: Aug 27, 1986  Admit date:     10/14/2024  Discharge date: 10/15/24  Discharge Physician: Norval Bar   PCP: Burney Darice CROME, MD   Recommendations at discharge:   Follow up with PCP within one week Refrain from recreational drug use Get repeat blood work in one week  Discharge Diagnoses: Principal Problem:   Intractable nausea and vomiting Active Problems:   Testicular pain   Hypokalemia  Resolved Problems:   * No resolved hospital problems. [facility] Hospital Course: No notes on file 38 y.o. male with medical history significant for testicular torsion status post bilateral orchiopexy in 2021 who now presents with nausea, vomiting, and pain in the left testicle. US  testes unremarkable. Testicular pain resolved spontaneously. Utox positive for THC. Concern for cannabis hyperemesis syndrome. Resolved with conservative care including bowel rest, antiemetics and IV hydration. Stable for discharge to follow up with PCP within one week.  - repeat blood work ordered to monitor hyperkalemia and leukocytosis likely reactive in nature  Pain control -   Controlled Substance Reporting System database was reviewed. and patient was instructed, not to drive, operate heavy machinery, perform activities at heights, swimming or participation in water activities or provide baby-sitting services while on Pain, Sleep and Anxiety Medications; until their outpatient Physician has advised to do so again. Also recommended to not to take more than prescribed Pain, Sleep and Anxiety Medications.  Consultants: None Procedures performed: None  Disposition: Home Diet recommendation:  Discharge Diet Orders (From admission, onward)     Start     Ordered   10/15/24 0000  Diet - low sodium heart healthy        10/15/24 1607           Regular diet DISCHARGE MEDICATION: Allergies as of 10/15/2024       Reactions    Doxycycline Rash        Medication List     TAKE these medications    clonazePAM  1 MG disintegrating tablet Commonly known as: KLONOPIN  Take 1 mg by mouth 3 (three) times daily as needed (panic attacks).   famotidine 40 MG tablet Commonly known as: PEPCID Take 40 mg by mouth daily.        Discharge Exam: Filed Weights   10/14/24 1651  Weight: 79.4 kg    Condition at discharge: fair  The results of significant diagnostics from this hospitalization (including imaging, microbiology, ancillary and laboratory) are listed below for reference.   Imaging Studies: US  SCROTUM W/DOPPLER Result Date: 10/14/2024 EXAM: ULTRASOUND SCROTUM/TESTICLES WITH DOPPLER FLOW EVALUATION 10/14/2024 05:39:31 PM TECHNIQUE: Duplex ultrasound using B-mode/gray scaled imaging, Doppler spectral analysis and color flow Doppler was obtained of the testicles. COMPARISON: None available. CLINICAL HISTORY: testicular pain FINDINGS: RIGHT: GREY SCALE: The right testicle measures 4.6 x 1.7 x 2.9 cm. It demonstrates normal homogeneous echotexture without focal lesion. No testicular microlithiasis. DOPPLER EVALUATION: There is normal vascularity on color Doppler examination of the testes. Spectral Doppler arterial and venous waveforms are normal. VARICOCELE: No scrotal varicocele. SCROTAL SAC: No hydrocele. EPIDIDYMIS: Right epididymal cyst measures 4 mm. LEFT: GREY SCALE: The left testicle measures 4.3 x 1.7 x 2.7 cm. It demonstrates normal homogeneous echotexture without focal lesion. No testicular microlithiasis. DOPPLER EVALUATION: There is normal vascularity on color Doppler examination of the testes. Spectral Doppler arterial and venous waveforms are normal. VARICOCELE: No scrotal varicocele. SCROTAL SAC: No hydrocele. EPIDIDYMIS: Multiple small left epididymal cysts measuring up to  5 mm. IMPRESSION: 1. Normal testes with normal Doppler flow. No evidence of torsion or other acute abnormality. 2. Subcentimeter  bilateral epididymal cysts, not clinically significant. Electronically signed by: Franky Crease MD 10/14/2024 06:06 PM EST RP Workstation: [id number]    Microbiology: Results for orders placed or performed during the hospital encounter of 10/14/24  Resp panel by RT-PCR (RSV, Flu A&B, Covid) Anterior Nasal Swab     Status: None   Collection Time: 10/14/24  5:43 PM   Specimen: Anterior Nasal Swab  Result Value Ref Range Status   SARS Coronavirus 2 by RT PCR NEGATIVE NEGATIVE Final   Influenza A by PCR NEGATIVE NEGATIVE Final   Influenza B by PCR NEGATIVE NEGATIVE Final    Comment: (NOTE) The Xpert Xpress SARS-CoV-2/FLU/RSV plus assay is intended as an aid in the diagnosis of influenza from Nasopharyngeal swab specimens and should not be used as a sole basis for treatment. Nasal washings and aspirates are unacceptable for Xpert Xpress SARS-CoV-2/FLU/RSV testing.  Fact Sheet for Patients: bloggercourse.com  Fact Sheet for Healthcare Providers: seriousbroker.it  This test is not yet approved or cleared by the [place]  FDA and has been authorized for detection and/or diagnosis of SARS-CoV-2 by FDA under an Emergency Use Authorization (EUA). This EUA will remain in effect (meaning this test can be used) for the duration of the COVID-19 declaration under Section 564(b)(1) of the Act, 21 U.S.C. section 360bbb-3(b)(1), unless the authorization is terminated or revoked.     Resp Syncytial Virus by PCR NEGATIVE NEGATIVE Final    Comment: (NOTE) Fact Sheet for Patients: bloggercourse.com  Fact Sheet for Healthcare Providers: seriousbroker.it  This test is not yet approved or cleared by the [place]  FDA and has been authorized for detection and/or diagnosis of SARS-CoV-2 by FDA under an Emergency Use Authorization (EUA). This EUA will remain in effect (meaning this test can be  used) for the duration of the COVID-19 declaration under Section 564(b)(1) of the Act, 21 U.S.C. section 360bbb-3(b)(1), unless the authorization is terminated or revoked.  Performed at [facility], [address], [place], [place] [address]     Labs: CBC: Recent Labs  Lab 10/14/24 1657 10/14/24 2112  WBC 17.6* 17.7*  NEUTROABS 15.6*  --   HGB 14.6 13.5  HCT 44.3 41.2  MCV 84.5 85.1  PLT 318 303   Basic Metabolic Panel: Recent Labs  Lab 10/14/24 1657 10/14/24 2112 10/15/24 1110  NA 144 145 143  K 3.1* 3.4* 3.3*  CL 103 108 107  CO2 29 29 27   GLUCOSE 110* 131* 131*  BUN 10 10 7   CREATININE 1.18 1.06 1.22  CALCIUM 8.8* 8.3* 8.6*  MG  --  1.7  1.8 2.3   Liver Function Tests: Recent Labs  Lab 10/14/24 1657  AST 29  ALT 21  ALKPHOS 58  BILITOT 1.2  PROT 7.3  ALBUMIN 4.0   CBG: No results for input(s): GLUCAP in the last 168 hours.  Discharge time spent: greater than 30 minutes.  Signed: Norval Bar, MD [facility] 10/15/2024
